# Patient Record
Sex: Male | Born: 1995 | Race: Black or African American | Hispanic: No | Marital: Single | Smoking: Never smoker
Health system: Southern US, Community
[De-identification: ages and names within clinical notes are randomized; demographics above are authoritative.]

## PROBLEM LIST (undated history)

## (undated) DIAGNOSIS — R630 Anorexia: Secondary | ICD-10-CM

## (undated) DIAGNOSIS — J45909 Unspecified asthma, uncomplicated: Secondary | ICD-10-CM

## (undated) DIAGNOSIS — F913 Oppositional defiant disorder: Secondary | ICD-10-CM

## (undated) DIAGNOSIS — F79 Unspecified intellectual disabilities: Secondary | ICD-10-CM

## (undated) DIAGNOSIS — R45851 Suicidal ideations: Secondary | ICD-10-CM

## (undated) DIAGNOSIS — T7840XA Allergy, unspecified, initial encounter: Secondary | ICD-10-CM

## (undated) DIAGNOSIS — F909 Attention-deficit hyperactivity disorder, unspecified type: Secondary | ICD-10-CM

## (undated) DIAGNOSIS — R45850 Homicidal ideations: Secondary | ICD-10-CM

## (undated) DIAGNOSIS — K219 Gastro-esophageal reflux disease without esophagitis: Secondary | ICD-10-CM

---
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
## 2017-07-16 ENCOUNTER — Other Ambulatory Visit: Payer: Self-pay

## 2017-07-16 ENCOUNTER — Ambulatory Visit (HOSPITAL_COMMUNITY)
Admission: EM | Admit: 2017-07-16 | Discharge: 2017-07-16 | Disposition: A | Discharge status: Home / Self Care | Payer: Medicaid Other | Attending: Family Medicine | Admitting: Family Medicine

## 2017-07-16 ENCOUNTER — Encounter (HOSPITAL_COMMUNITY): Payer: Self-pay | Admitting: Emergency Medicine

## 2017-07-16 ENCOUNTER — Ambulatory Visit (INDEPENDENT_AMBULATORY_CARE_PROVIDER_SITE_OTHER): Payer: Medicaid Other

## 2017-07-16 DIAGNOSIS — S39012A Strain of muscle, fascia and tendon of lower back, initial encounter: Secondary | ICD-10-CM

## 2017-07-16 DIAGNOSIS — R0789 Other chest pain: Secondary | ICD-10-CM | POA: Diagnosis not present

## 2017-07-16 MED ORDER — KETOROLAC TROMETHAMINE 30 MG/ML IJ SOLN
30.0000 mg | Freq: Once | INTRAMUSCULAR | Status: AC
  Administered 2017-07-16: 30 mg via INTRAMUSCULAR

## 2017-07-16 MED ORDER — ALBUTEROL SULFATE HFA 108 (90 BASE) MCG/ACT IN AERS: 2.0000 | INHALATION_SPRAY | Freq: Four times a day (QID) | RESPIRATORY_TRACT | 0 refills | Status: DC | PRN

## 2017-07-16 MED ORDER — NAPROXEN 375 MG PO TABS: 375.0000 mg | ORAL_TABLET | Freq: Two times a day (BID) | ORAL | 0 refills | Status: DC

## 2017-07-16 MED ORDER — KETOROLAC TROMETHAMINE 30 MG/ML IJ SOLN
INTRAMUSCULAR | Status: AC
  Filled 2017-07-16: qty 1

## 2017-07-16 NOTE — ED Notes (Signed)
Patient discharged by provider.

## 2017-07-16 NOTE — ED Triage Notes (Signed)
C/o low back pain onset one week and today onset of chest pain

## 2017-07-16 NOTE — Discharge Instructions (Signed)
Naproxen twice a day, take with food. Use of inhaler every 6 hours as needed may help some with chest pain. See back exercises to initiate. Please follow up with your primary care provider in the next 1-2 weeks for recheck of symptoms. If worsening of chest pain, develop dizziness, shortness of breath or otherwise worsening please go to ER for further evaluation.

## 2017-07-16 NOTE — ED Provider Notes (Signed)
[facility]    CSN: [id number] Arrival date & time: 07/16/17  1647     History   Chief Complaint Chief Complaint  Patient presents with  . Back Pain    HPI LEUL NARRAMORE is a 22 y.o. male.   Yehoshua presents with his mother with complaints of right low back pain which has been ongoing for months now, worse with playing basketball, has been off and on. Also complaints of left sided chest pain which started this afternoon 1 hour prior to arrival. Worse with activity and with deep breathing. Has had occasional shortness of breath  For some time, worse with activity. Slight cough. Without congestion. Has not taken any medications for his back pain. Took aspirin this afternoon for chest pain. Significant behavioral health history, asthma, htn, gerd.    ROS per HPI.      Past Medical History:  Diagnosis Date  . ADHD (attention deficit hyperactivity disorder)   . Allergy   . Appetite lost 07/20/2015  . Asthma   . GERD (gastroesophageal reflux disease) 07/20/2015  . Homicidal ideations   . Intellectual disability 02/26/2015  . ODD (oppositional defiant disorder)   . Suicidal ideations     Patient Active Problem List   Diagnosis Date Noted  . Aggressive behavior 11/30/2015  . Hypertension   . Intentional overdose of drug in tablet form (HCC)   . Other secondary hypertension   . Somnolence   . Overdose 11/09/2015  . GERD (gastroesophageal reflux disease) 07/20/2015  . Appetite lost 07/20/2015  . Suicidal ideation   . DMDD (disruptive mood dysregulation disorder) (HCC) 03/13/2015  . Homicidal ideations 03/13/2015  . Intellectual disability 02/26/2015  . Attention-deficit hyperactivity disorder, predominantly hyperactive type   . ADHD (attention deficit hyperactivity disorder), combined type 01/31/2015  . Oppositional defiant disorder 01/31/2015  . Mood disorder (HCC) 01/31/2015    History reviewed. No pertinent surgical history.     Home Medications     Prior to Admission medications   Medication Sig Start Date End Date Taking? Authorizing Provider  FLUoxetine (PROZAC) 10 MG tablet Take 10 mg by mouth daily.   Yes [provider]  gabapentin (NEURONTIN) 300 MG capsule Take 300 mg by mouth 3 (three) times daily.   Yes [provider]  adapalene (DIFFERIN) 0.1 % gel Apply 1 application topically See admin instructions. Apply to face every other night (alternate with benzaclin gel)    [provider]  albuterol (PROAIR HFA) 108 (90 BASE) MCG/ACT inhaler Inhale 2 puffs into the lungs every 6 (six) hours as needed for wheezing or shortness of breath.    [provider]  cetirizine (ZYRTEC) 10 MG tablet Take 1 tablet (10 mg total) by mouth daily. 11/15/15   Freddrick March, MD  clindamycin-benzoyl peroxide (BENZACLIN) gel Apply 1 application topically See admin instructions. Apply thin amount to face every other night (alternate with differin gel)    [provider]  fluticasone (FLONASE) 50 MCG/ACT nasal spray Place 1 spray into both nostrils daily. Patient taking differently: Place 1 spray into both nostrils daily as needed for allergies.  11/15/15   Freddrick March, MD  fluticasone (FLOVENT HFA) 220 MCG/ACT inhaler Inhale 2 puffs into the lungs daily.     [provider]  guanFACINE (INTUNIV) 2 MG TB24 ER tablet Take 2 mg by mouth at bedtime. 07/09/16   [provider]  montelukast (SINGULAIR) 10 MG tablet Take 1 tablet (10 mg total) by mouth at bedtime. Patient not  taking: Reported on 08/03/2016 11/15/15   Freddrick MarchAmin, Yashika, MD  montelukast (SINGULAIR) 5 MG chewable tablet Chew 5 mg by mouth at bedtime.  07/09/16   [provider]  naproxen (NAPROSYN) 375 MG tablet Take 1 tablet (375 mg total) by mouth 2 (two) times daily. 07/16/17   Georgetta HaberBurky, Jermaine Tholl B, NP  omeprazole (PRILOSEC) 10 MG capsule Take 1 capsule (10 mg total) by mouth daily. Patient taking differently: Take 10 mg by mouth daily  before breakfast.  11/15/15   Freddrick MarchAmin, Yashika, MD  traZODone (DESYREL) 150 MG tablet Take 300 mg by mouth at bedtime 01/26/16   [provider]  ziprasidone (GEODON) 40 MG capsule Take 40 mg by mouth 2 (two) times daily with a meal.     [provider]    Family History Family History  Adopted: Yes    Social History Social History   Tobacco Use  . Smoking status: Never Smoker  . Smokeless tobacco: Never Used  Substance Use Topics  . Alcohol use: Yes    Comment: I mix henessy with gatorade  . Drug use: No     Allergies   Patient has no known allergies.   Review of Systems Review of Systems   Physical Exam Triage Vital Signs ED Triage Vitals  Enc Vitals Group     BP 07/16/17 1711 (!) 99/58     Pulse Rate 07/16/17 1711 64     Resp --      Temp 07/16/17 1711 (!) 97.2 F (36.2 C)     Temp Source 07/16/17 1711 Oral     SpO2 07/16/17 1711 99 %     Weight --      Height --      Head Circumference --      Peak Flow --      Pain Score 07/16/17 1707 4     Pain Loc --      Pain Edu? --      Excl. in GC? --    No data found.  Updated Vital Signs BP (!) 99/58 (BP Location: Left Arm)   Pulse 64   Temp (!) 97.2 F (36.2 C) (Oral)   SpO2 99%   Visual Acuity Right Eye Distance:   Left Eye Distance:   Bilateral Distance:    Right Eye Near:   Left Eye Near:    Bilateral Near:     Physical Exam  Constitutional: He is oriented to person, place, and time. He appears well-developed and well-nourished.  Cardiovascular: Normal rate, regular rhythm, normal heart sounds and normal pulses.  Pulmonary/Chest: Effort normal and breath sounds normal.  Musculoskeletal:       Lumbar back: He exhibits tenderness and pain. He exhibits normal range of motion, no bony tenderness, no swelling, no spasm and normal pulse.       Back:  Neurological: He is alert and oriented to person, place, and time.  Skin: Skin is warm and dry.   EKG with sinus bradycardia without  acute findings.   UC Treatments / Results  Labs (all labs ordered are listed, but only abnormal results are displayed) Labs Reviewed - No data to display  EKG  EKG Interpretation None       Radiology Dg Chest 2 View  Result Date: 07/16/2017 CLINICAL DATA:  Chest pain shortness of breath, LEFT anterior chest wall pain for 1 hour, history asthma EXAM: CHEST - 2 VIEW COMPARISON:  None FINDINGS: Normal heart size, mediastinal contours, and pulmonary vascularity. Minimal peribronchial  thickening. No pulmonary infiltrate, pleural effusion, or pneumothorax. Bones unremarkable. IMPRESSION: Minimal peribronchial thickening which could reflect bronchitis or asthma. No acute infiltrate. Electronically Signed   By: Ulyses Southward M.D.   On: 07/16/2017 18:43    Procedures Procedures (including critical care time)  Medications Ordered in UC Medications  ketorolac (TORADOL) 30 MG/ML injection 30 mg (30 mg Intramuscular Given 07/16/17 1803)     Initial Impression / Assessment and Plan / UC Course  I have reviewed the triage vital signs and the nursing notes.  Pertinent labs & imaging results that were available during my care of the patient were reviewed by me and considered in my medical decision making (see chart for details).     toradol provided and recommend naproxen twice a day for back pain. Reflux vs anxiety vs asthma as source of chest pain. Non toxic in appearance, without distress, without shortness of breath. ekg and chest xray without acute findings. Naproxen twice a day for pain. Encouraged close follow up with primary care doctor for recheck as may need further evaluation and treatment if no improvement of symptoms. Patient verbalized understanding and agreeable to plan.    Final Clinical Impressions(s) / UC Diagnoses   Final diagnoses:  Strain of lumbar region, initial encounter  Other chest pain    ED Discharge Orders        Ordered    naproxen (NAPROSYN) 375 MG tablet  2  times daily     07/16/17 1834       Controlled Substance Prescriptions [place] Controlled Substance Registry consulted? Not Applicable   Georgetta Haber, NP 07/16/17 [phone]

## 2017-07-27 ENCOUNTER — Encounter (HOSPITAL_COMMUNITY): Payer: Self-pay | Admitting: Emergency Medicine

## 2017-07-27 ENCOUNTER — Other Ambulatory Visit: Payer: Self-pay

## 2017-07-27 ENCOUNTER — Emergency Department (HOSPITAL_COMMUNITY)
Admission: EM | Admit: 2017-07-27 | Discharge: 2017-07-28 | Disposition: A | Discharge status: Other Acute Inpatient Hospital | Payer: Medicaid Other | Attending: Emergency Medicine | Admitting: Emergency Medicine

## 2017-07-27 DIAGNOSIS — R45851 Suicidal ideations: Secondary | ICD-10-CM

## 2017-07-27 DIAGNOSIS — F79 Unspecified intellectual disabilities: Secondary | ICD-10-CM | POA: Insufficient documentation

## 2017-07-27 DIAGNOSIS — R45850 Homicidal ideations: Secondary | ICD-10-CM | POA: Insufficient documentation

## 2017-07-27 DIAGNOSIS — F902 Attention-deficit hyperactivity disorder, combined type: Secondary | ICD-10-CM | POA: Insufficient documentation

## 2017-07-27 DIAGNOSIS — F913 Oppositional defiant disorder: Secondary | ICD-10-CM | POA: Insufficient documentation

## 2017-07-27 DIAGNOSIS — F3481 Disruptive mood dysregulation disorder: Secondary | ICD-10-CM | POA: Insufficient documentation

## 2017-07-27 DIAGNOSIS — Z79899 Other long term (current) drug therapy: Secondary | ICD-10-CM | POA: Insufficient documentation

## 2017-07-27 DIAGNOSIS — F332 Major depressive disorder, recurrent severe without psychotic features: Secondary | ICD-10-CM | POA: Insufficient documentation

## 2017-07-27 DIAGNOSIS — I10 Essential (primary) hypertension: Secondary | ICD-10-CM | POA: Insufficient documentation

## 2017-07-27 LAB — COMPREHENSIVE METABOLIC PANEL
ALBUMIN: 4.7 g/dL (ref 3.5–5.0)
ALK PHOS: 114 U/L (ref 38–126)
ALT: 37 U/L (ref 17–63)
ANION GAP: 10 (ref 5–15)
AST: 44 U/L — ABNORMAL HIGH (ref 15–41)
BUN: 15 mg/dL (ref 6–20)
CALCIUM: 9.5 mg/dL (ref 8.9–10.3)
CO2: 29 mmol/L (ref 22–32)
Chloride: 103 mmol/L (ref 101–111)
Creatinine, Ser: 1.24 mg/dL (ref 0.61–1.24)
GFR calc Af Amer: >60 mL/min (ref >60)
GFR calc non Af Amer: >60 mL/min (ref >60)
Glucose, Bld: 97 mg/dL (ref 65–99)
POTASSIUM: 4 mmol/L (ref 3.5–5.1)
SODIUM: 142 mmol/L (ref 135–145)
Total Bilirubin: 1.2 mg/dL (ref 0.3–1.2)
Total Protein: 8.4 g/dL — ABNORMAL HIGH (ref 6.5–8.1)

## 2017-07-27 LAB — RAPID URINE DRUG SCREEN, HOSP PERFORMED
Amphetamines: NOT DETECTED
Barbiturates: NOT DETECTED
Benzodiazepines: NOT DETECTED
COCAINE: NOT DETECTED
OPIATES: NOT DETECTED
TETRAHYDROCANNABINOL: NOT DETECTED

## 2017-07-27 LAB — CBC
HCT: 46.9 % (ref 39.0–52.0)
HEMOGLOBIN: 15.4 g/dL (ref 13.0–17.0)
MCH: 26.8 pg (ref 26.0–34.0)
MCHC: 32.8 g/dL (ref 30.0–36.0)
MCV: 81.6 fL (ref 78.0–100.0)
Platelets: 212 10*3/uL (ref 150–400)
RBC: 5.75 MIL/uL (ref 4.22–5.81)
RDW: 14.4 % (ref 11.5–15.5)
WBC: 6.3 10*3/uL (ref 4.0–10.5)

## 2017-07-27 LAB — URINALYSIS, ROUTINE W REFLEX MICROSCOPIC
Bilirubin Urine: NEGATIVE
GLUCOSE, UA: NEGATIVE mg/dL
Hgb urine dipstick: NEGATIVE
Ketones, ur: NEGATIVE mg/dL
LEUKOCYTES UA: NEGATIVE
Nitrite: NEGATIVE
PH: 7 (ref 5.0–8.0)
Protein, ur: NEGATIVE mg/dL
Specific Gravity, Urine: 1.017 (ref 1.005–1.030)

## 2017-07-27 LAB — ETHANOL: Alcohol, Ethyl (B): <10 mg/dL (ref <10)

## 2017-07-27 LAB — ACETAMINOPHEN LEVEL

## 2017-07-27 LAB — SALICYLATE LEVEL: Salicylate Lvl: <7 mg/dL (ref 2.8–30.0)

## 2017-07-27 MED ORDER — FLUTICASONE PROPIONATE HFA 220 MCG/ACT IN AERO: 2.0000 | INHALATION_SPRAY | Freq: Every day | RESPIRATORY_TRACT | Status: DC

## 2017-07-27 MED ORDER — BUDESONIDE 0.5 MG/2ML IN SUSP
0.5000 mg | Freq: Two times a day (BID) | RESPIRATORY_TRACT | Status: DC
  Filled 2017-07-27 (×4): qty 2

## 2017-07-27 MED ORDER — LORATADINE 10 MG PO TABS
10.0000 mg | ORAL_TABLET | Freq: Every day | ORAL | Status: DC
  Administered 2017-07-28: 10 mg via ORAL
  Filled 2017-07-27: qty 1

## 2017-07-27 MED ORDER — FLUTICASONE PROPIONATE 50 MCG/ACT NA SUSP
1.0000 | Freq: Every day | NASAL | Status: DC | PRN
Start: 2017-07-27 — End: 2017-07-28

## 2017-07-27 MED ORDER — ZIPRASIDONE HCL 20 MG PO CAPS
80.0000 mg | ORAL_CAPSULE | Freq: Two times a day (BID) | ORAL | Status: DC
  Administered 2017-07-27 – 2017-07-28 (×2): 80 mg via ORAL
  Filled 2017-07-27 (×3): qty 4

## 2017-07-27 MED ORDER — PANTOPRAZOLE SODIUM 40 MG PO TBEC
40.0000 mg | DELAYED_RELEASE_TABLET | Freq: Every day | ORAL | Status: DC
Start: 2017-07-28 — End: 2017-07-28
  Administered 2017-07-28: 40 mg via ORAL
  Filled 2017-07-27: qty 1

## 2017-07-27 MED ORDER — ONDANSETRON HCL 4 MG PO TABS: 4.0000 mg | ORAL_TABLET | Freq: Three times a day (TID) | ORAL | Status: DC | PRN

## 2017-07-27 MED ORDER — MONTELUKAST SODIUM 5 MG PO CHEW
5.0000 mg | CHEWABLE_TABLET | Freq: Every day | ORAL | Status: DC
  Administered 2017-07-27: 5 mg via ORAL
  Filled 2017-07-27: qty 1

## 2017-07-27 MED ORDER — TRAZODONE HCL 100 MG PO TABS
100.0000 mg | ORAL_TABLET | Freq: Every day | ORAL | Status: DC
  Administered 2017-07-27: 100 mg via ORAL
  Filled 2017-07-27: qty 1

## 2017-07-27 MED ORDER — GUANFACINE HCL ER 1 MG PO TB24
4.0000 mg | ORAL_TABLET | Freq: Every day | ORAL | Status: DC
  Administered 2017-07-28: 4 mg via ORAL
  Filled 2017-07-27: qty 4

## 2017-07-27 MED ORDER — ALBUTEROL SULFATE HFA 108 (90 BASE) MCG/ACT IN AERS: 2.0000 | INHALATION_SPRAY | Freq: Four times a day (QID) | RESPIRATORY_TRACT | Status: DC | PRN

## 2017-07-27 MED ORDER — GABAPENTIN 300 MG PO CAPS
300.0000 mg | ORAL_CAPSULE | Freq: Two times a day (BID) | ORAL | Status: DC
  Administered 2017-07-27 – 2017-07-28 (×2): 300 mg via ORAL
  Filled 2017-07-27 (×2): qty 1

## 2017-07-27 MED ORDER — ACETAMINOPHEN 325 MG PO TABS: 650.0000 mg | ORAL_TABLET | ORAL | Status: DC | PRN

## 2017-07-27 NOTE — ED Notes (Signed)
Patient with TTS  

## 2017-07-27 NOTE — ED Notes (Addendum)
Patient appear hyperactive. Moving and jumping around in his room. Pulling off his pants and running out to the hall way. Patient was easily redirectable. Dr Madilyn Hookees notified of patient's behavior and request to start scheduled Geodon tonight.

## 2017-07-27 NOTE — ED Notes (Addendum)
Patient admitted on unit. Cheerful and pleasant upon approach. Denies active SI/HI, AH/VH at this time. Patient contracts for safety while on unit. Patient stated "I just want my meds and sleep". Snacks and drink offered. Routine safety checks maintained. will continue to monitor patient.

## 2017-07-27 NOTE — ED Notes (Signed)
Patient refused Neb treatment stated "I only take it when I wheeze. I am good now".  Patient respiration even and unlabored. No distress noted. Encouraged patient to asked for it when needed. Will continue to monitor patient.
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
## 2017-07-27 NOTE — ED Notes (Signed)
Patient has a legal guardian (his mom) - Sandrea HammondWendy Mehlhoff. Phone # [phone] Copy is in patient's chart.

## 2017-07-27 NOTE — ED Notes (Signed)
Per Dr Madilyn Hookees, patient can start Geodon 80 mg PO tonight

## 2017-07-27 NOTE — ED Notes (Signed)
Bed: [facility] Expected date:  Expected time:  Means of arrival:  Comments: 

## 2017-07-28 ENCOUNTER — Inpatient Hospital Stay (HOSPITAL_COMMUNITY)
Admission: AD | Admit: 2017-07-28 | Discharge: 2017-08-13 | DRG: 885 | Disposition: A | Discharge status: Home / Self Care | Payer: Medicaid Other | Attending: Psychiatry | Admitting: Psychiatry

## 2017-07-28 ENCOUNTER — Encounter (HOSPITAL_COMMUNITY): Payer: Self-pay | Admitting: *Deleted

## 2017-07-28 ENCOUNTER — Other Ambulatory Visit: Payer: Self-pay

## 2017-07-28 DIAGNOSIS — F79 Unspecified intellectual disabilities: Secondary | ICD-10-CM | POA: Diagnosis present

## 2017-07-28 DIAGNOSIS — Z560 Unemployment, unspecified: Secondary | ICD-10-CM | POA: Diagnosis not present

## 2017-07-28 DIAGNOSIS — Z62821 Parent-adopted child conflict: Secondary | ICD-10-CM

## 2017-07-28 DIAGNOSIS — F332 Major depressive disorder, recurrent severe without psychotic features: Secondary | ICD-10-CM | POA: Diagnosis not present

## 2017-07-28 DIAGNOSIS — F3481 Disruptive mood dysregulation disorder: Secondary | ICD-10-CM | POA: Diagnosis present

## 2017-07-28 DIAGNOSIS — I158 Other secondary hypertension: Secondary | ICD-10-CM | POA: Diagnosis present

## 2017-07-28 DIAGNOSIS — J45909 Unspecified asthma, uncomplicated: Secondary | ICD-10-CM | POA: Diagnosis present

## 2017-07-28 DIAGNOSIS — F419 Anxiety disorder, unspecified: Secondary | ICD-10-CM | POA: Diagnosis not present

## 2017-07-28 DIAGNOSIS — Z6229 Other upbringing away from parents: Secondary | ICD-10-CM | POA: Diagnosis not present

## 2017-07-28 DIAGNOSIS — K219 Gastro-esophageal reflux disease without esophagitis: Secondary | ICD-10-CM | POA: Diagnosis present

## 2017-07-28 DIAGNOSIS — R45851 Suicidal ideations: Secondary | ICD-10-CM | POA: Diagnosis present

## 2017-07-28 DIAGNOSIS — F913 Oppositional defiant disorder: Secondary | ICD-10-CM | POA: Diagnosis present

## 2017-07-28 DIAGNOSIS — Z79899 Other long term (current) drug therapy: Secondary | ICD-10-CM | POA: Diagnosis not present

## 2017-07-28 DIAGNOSIS — F17200 Nicotine dependence, unspecified, uncomplicated: Secondary | ICD-10-CM | POA: Diagnosis present

## 2017-07-28 DIAGNOSIS — Z813 Family history of other psychoactive substance abuse and dependence: Secondary | ICD-10-CM

## 2017-07-28 DIAGNOSIS — R451 Restlessness and agitation: Secondary | ICD-10-CM | POA: Diagnosis not present

## 2017-07-28 DIAGNOSIS — Z915 Personal history of self-harm: Secondary | ICD-10-CM | POA: Diagnosis not present

## 2017-07-28 DIAGNOSIS — G4700 Insomnia, unspecified: Secondary | ICD-10-CM | POA: Diagnosis present

## 2017-07-28 DIAGNOSIS — Z7951 Long term (current) use of inhaled steroids: Secondary | ICD-10-CM | POA: Diagnosis not present

## 2017-07-28 DIAGNOSIS — I10 Essential (primary) hypertension: Secondary | ICD-10-CM | POA: Diagnosis present

## 2017-07-28 DIAGNOSIS — F901 Attention-deficit hyperactivity disorder, predominantly hyperactive type: Secondary | ICD-10-CM | POA: Diagnosis present

## 2017-07-28 MED ORDER — LORAZEPAM 0.5 MG PO TABS
0.5000 mg | ORAL_TABLET | Freq: Four times a day (QID) | ORAL | Status: DC | PRN
  Administered 2017-07-29 – 2017-08-12 (×11): 0.5 mg via ORAL
  Filled 2017-07-28 (×11): qty 1

## 2017-07-28 MED ORDER — GUANFACINE HCL ER 1 MG PO TB24
1.0000 mg | ORAL_TABLET | Freq: Every day | ORAL | Status: DC
  Filled 2017-07-28: qty 1

## 2017-07-28 MED ORDER — ZIPRASIDONE HCL 80 MG PO CAPS
80.0000 mg | ORAL_CAPSULE | Freq: Two times a day (BID) | ORAL | Status: DC
Start: 2017-07-29 — End: 2017-08-14
  Administered 2017-07-29 – 2017-08-13 (×31): 80 mg via ORAL
  Filled 2017-07-28 (×37): qty 1

## 2017-07-28 MED ORDER — ACETAMINOPHEN 325 MG PO TABS
650.0000 mg | ORAL_TABLET | Freq: Four times a day (QID) | ORAL | Status: DC | PRN
  Administered 2017-07-31 – 2017-08-13 (×13): 650 mg via ORAL
  Filled 2017-07-28 (×14): qty 2

## 2017-07-28 MED ORDER — HYDROXYZINE HCL 25 MG PO TABS
25.0000 mg | ORAL_TABLET | Freq: Three times a day (TID) | ORAL | Status: DC | PRN
  Administered 2017-07-29 – 2017-08-02 (×5): 25 mg via ORAL
  Filled 2017-07-28 (×5): qty 1

## 2017-07-28 MED ORDER — GABAPENTIN 300 MG PO CAPS
300.0000 mg | ORAL_CAPSULE | Freq: Two times a day (BID) | ORAL | Status: DC
  Administered 2017-07-28 – 2017-07-30 (×4): 300 mg via ORAL
  Filled 2017-07-28 (×10): qty 1

## 2017-07-28 MED ORDER — ALBUTEROL SULFATE HFA 108 (90 BASE) MCG/ACT IN AERS: 1.0000 | INHALATION_SPRAY | Freq: Four times a day (QID) | RESPIRATORY_TRACT | Status: DC | PRN

## 2017-07-28 MED ORDER — FLUTICASONE PROPIONATE 50 MCG/ACT NA SUSP: 1.0000 | Freq: Every day | NASAL | Status: DC | PRN

## 2017-07-28 MED ORDER — MAGNESIUM HYDROXIDE 400 MG/5ML PO SUSP: 30.0000 mL | Freq: Every day | ORAL | Status: DC | PRN

## 2017-07-28 MED ORDER — TRAZODONE HCL 100 MG PO TABS
100.0000 mg | ORAL_TABLET | Freq: Every day | ORAL | Status: DC
  Administered 2017-07-28 – 2017-07-29 (×2): 100 mg via ORAL
  Filled 2017-07-28 (×5): qty 1

## 2017-07-28 MED ORDER — PANTOPRAZOLE SODIUM 40 MG PO TBEC
40.0000 mg | DELAYED_RELEASE_TABLET | Freq: Every day | ORAL | Status: DC
Start: 2017-07-29 — End: 2017-08-14
  Administered 2017-07-29 – 2017-08-13 (×16): 40 mg via ORAL
  Filled 2017-07-28 (×19): qty 1

## 2017-07-28 MED ORDER — GUANFACINE HCL ER 4 MG PO TB24
4.0000 mg | ORAL_TABLET | Freq: Every day | ORAL | Status: DC
  Administered 2017-07-29: 4 mg via ORAL
  Filled 2017-07-28 (×2): qty 1

## 2017-07-28 MED ORDER — BUDESONIDE 0.5 MG/2ML IN SUSP
0.5000 mg | Freq: Two times a day (BID) | RESPIRATORY_TRACT | Status: DC
  Administered 2017-08-03 – 2017-08-08 (×3): 0.5 mg via RESPIRATORY_TRACT
  Filled 2017-07-28 (×36): qty 2

## 2017-07-28 MED ORDER — LORATADINE 10 MG PO TABS
10.0000 mg | ORAL_TABLET | Freq: Every day | ORAL | Status: DC
Start: 2017-07-29 — End: 2017-08-14
  Administered 2017-07-29 – 2017-08-13 (×16): 10 mg via ORAL
  Filled 2017-07-28 (×21): qty 1

## 2017-07-28 MED ORDER — MONTELUKAST SODIUM 5 MG PO CHEW
5.0000 mg | CHEWABLE_TABLET | Freq: Every day | ORAL | Status: DC
  Administered 2017-07-29: 5 mg via ORAL
  Filled 2017-07-28 (×3): qty 1

## 2017-07-28 MED ORDER — TRAZODONE HCL 50 MG PO TABS: 50.0000 mg | ORAL_TABLET | Freq: Every evening | ORAL | Status: DC | PRN

## 2017-07-28 MED ORDER — ONDANSETRON HCL 4 MG PO TABS: 4.0000 mg | ORAL_TABLET | Freq: Three times a day (TID) | ORAL | Status: DC | PRN

## 2017-07-28 MED ORDER — ALBUTEROL SULFATE HFA 108 (90 BASE) MCG/ACT IN AERS: 2.0000 | INHALATION_SPRAY | Freq: Four times a day (QID) | RESPIRATORY_TRACT | Status: DC | PRN

## 2017-07-28 MED ORDER — ALUM & MAG HYDROXIDE-SIMETH 200-200-20 MG/5ML PO SUSP: 30.0000 mL | ORAL | Status: DC | PRN

## 2017-07-28 MED ORDER — GABAPENTIN 100 MG PO CAPS
100.0000 mg | ORAL_CAPSULE | Freq: Three times a day (TID) | ORAL | Status: DC
  Administered 2017-07-28: 100 mg via ORAL
  Filled 2017-07-28 (×3): qty 1

## 2017-07-28 MED ORDER — ZIPRASIDONE HCL 20 MG PO CAPS
20.0000 mg | ORAL_CAPSULE | Freq: Two times a day (BID) | ORAL | Status: DC
  Administered 2017-07-28: 20 mg via ORAL
  Filled 2017-07-28 (×3): qty 1
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
## 2017-07-28 NOTE — ED Notes (Signed)
Patient alert and oriented, taking po medications without difficulty.  Patient had breakfast and now is resting again.
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
## 2017-07-28 NOTE — Progress Notes (Signed)
Adult Psychoeducational Group Note  Date:  07/28/2017 Time:  10:09 PM  Group Topic/Focus:  Wrap-Up Group:   The focus of this group is to help patients review their daily goal of treatment and discuss progress on daily workbooks.  Participation Level:  Active  Participation Quality:  Appropriate  Affect:  Appropriate  Cognitive:  Appropriate  Insight: Appropriate  Engagement in Group:  Engaged  Modes of Intervention:  Discussion  Additional Comments:  Patient attended group and said that his day was a 4.  His coping skills were socializing, writing and writing his music.   Izella Ybanez W Julane Crock 07/28/2017, 10:09 PM
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
## 2017-07-28 NOTE — ED Notes (Signed)
Report called to Jan RN at behavioral health.

## 2017-07-28 NOTE — Tx Team (Signed)
Initial Treatment Plan 07/28/2017 4:36 PM Maurice [name] [place] [id number]    PATIENT STRESSORS: Marital or family conflict   PATIENT STRENGTHS: Communication skills General fund of knowledge Physical Health Special hobby/interest Supportive family/friends   PATIENT IDENTIFIED PROBLEMS: SI  depression  anger                 DISCHARGE CRITERIA:  Ability to meet basic life and health needs Adequate post-discharge living arrangements Improved stabilization in mood, thinking, and/or behavior Motivation to continue treatment in a less acute level of care Need for constant or close observation no longer present Reduction of life-threatening or endangering symptoms to within safe limits Safe-care adequate arrangements made Verbal commitment to aftercare and medication compliance  PRELIMINARY DISCHARGE PLAN: Outpatient therapy Return to previous living arrangement  PATIENT/FAMILY INVOLVEMENT: This treatment plan has been presented to and reviewed with the patient, Maurice [name] [place], and/or family member, .  The patient and family have been given the opportunity to ask questions and make suggestions.  Beatrix ShipperWright, Chava Dulac Martin, RN 07/28/2017, 4:36 PM

## 2017-07-28 NOTE — Plan of Care (Signed)
  Problem: Safety: Goal: Periods of time without injury will increase Outcome: Progressing   Problem: Coping: Goal: Coping ability will improve Outcome: Progressing   

## 2017-07-28 NOTE — BHH Counselor (Signed)
Phone call to pt's mother (& guardian), Ubaldo Daywalt, [phone],  to update re: pt's admission to [facility]. Ms. Currey states she has an appointment at [facility] and will make contact with [facility] afterwards. Ms. Dettman was advised that Richelle Ito from [facility] met with pt and she will continue to work with [facility] on finding a new group home for pt. Venetia Constable is also working on respite and other in-home supports. The [facility] was also discussed as a future option as a preventive option to give mother a break. Richelle Ito, [phone], Clinical Coordinator at [facility], would like to participate in pt's d/c plan when leaving the hospital.

## 2017-07-28 NOTE — Progress Notes (Signed)
D: Pt denies SI/HI/AVH. Pt is pleasant and cooperative. Pt has been observed trying to go into male room. Pt was informed that this was not acceptable and pt appeared tom understand. Pt visible on milieu interacting with peers. Pt has minimal interaction with writer and forwards little, but will answer questions.   A: Pt was offered support and encouragement. Pt was given scheduled medications. Pt was encourage to attend groups. Q 15 minute checks were done for safety.   R:Pt attends groups and interacts well with peers and staff. Pt is taking medication. Pt has no complaints.Pt receptive to treatment and safety maintained on unit.

## 2017-07-28 NOTE — ED Notes (Signed)
Attempted to call report to behavioral health.  Nurse will call back.
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
## 2017-07-29 DIAGNOSIS — Z79899 Other long term (current) drug therapy: Secondary | ICD-10-CM

## 2017-07-29 DIAGNOSIS — F332 Major depressive disorder, recurrent severe without psychotic features: Principal | ICD-10-CM

## 2017-07-29 DIAGNOSIS — F419 Anxiety disorder, unspecified: Secondary | ICD-10-CM

## 2017-07-29 LAB — LIPID PANEL
CHOL/HDL RATIO: 2.5 ratio
CHOLESTEROL: 156 mg/dL (ref 0–200)
HDL: 62 mg/dL (ref >40)
LDL CALC: 84 mg/dL (ref 0–99)
TRIGLYCERIDES: 50 mg/dL (ref <150)
VLDL: 10 mg/dL (ref 0–40)

## 2017-07-29 LAB — HEMOGLOBIN A1C
Hgb A1c MFr Bld: 5.8 % — ABNORMAL HIGH (ref 4.8–5.6)
Mean Plasma Glucose: 119.76 mg/dL

## 2017-07-29 LAB — TSH: TSH: 4.508 u[IU]/mL — ABNORMAL HIGH (ref 0.350–4.500)
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
## 2017-07-29 NOTE — Progress Notes (Signed)
Pt was turing on the light and playing with the [facility] unit. Pt was keeping his new roommate up.

## 2017-07-29 NOTE — Progress Notes (Signed)
Pt found in the dayroom by staff massaging a male pt feet. Pt stated when asked that he did it  unconsciously and became irritable about it. Pt informed that, that was inappropriate and not allowed in the unit.
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
## 2017-07-29 NOTE — Tx Team (Signed)
Interdisciplinary Treatment and Diagnostic Plan Update  07/29/2017 Time of Session: 11:00am Maurice DollarJaivon O [name] MRN: [id number]  Principal Diagnosis: MDD (major depressive disorder), recurrent severe, without psychosis (HCC)  Secondary Diagnoses: Principal Problem:   MDD (major depressive disorder), recurrent severe, without psychosis (HCC)   Current Medications:  Current Facility-Administered Medications  Medication Dose Route Frequency Provider Last Rate Last Dose  . acetaminophen (TYLENOL) tablet 650 mg  650 mg Oral Q6H PRN Laveda AbbeParks, Laurie Britton, NP      . albuterol (PROVENTIL HFA;VENTOLIN HFA) 108 (90 Base) MCG/ACT inhaler 2 puff  2 puff Inhalation Q6H PRN Laveda AbbeParks, Laurie Britton, NP      . alum & mag hydroxide-simeth (MAALOX/MYLANTA) 200-200-20 MG/5ML suspension 30 mL  30 mL Oral Q4H PRN Laveda AbbeParks, Laurie Britton, NP      . budesonide (PULMICORT) nebulizer solution 0.5 mg  0.5 mg Nebulization BID Laveda AbbeParks, Laurie Britton, NP      . fluticasone [facility](FLONASE) 50 MCG/ACT nasal spray 1 spray  1 spray Each Nare Daily PRN Laveda AbbeParks, Laurie Britton, NP      . gabapentin (NEURONTIN) capsule 300 mg  300 mg Oral BID Laveda AbbeParks, Laurie Britton, NP   300 mg at 07/29/17 0758  . hydrOXYzine (ATARAX/VISTARIL) tablet 25 mg  25 mg Oral TID PRN Laveda AbbeParks, Laurie Britton, NP      . loratadine (CLARITIN) tablet 10 mg  10 mg Oral Daily Laveda AbbeParks, Laurie Britton, NP   10 mg at 07/29/17 0758  . LORazepam (ATIVAN) tablet 0.5 mg  0.5 mg Oral Q6H PRN Cobos, Rockey SituFernando A, MD   0.5 mg at 07/29/17 0058  . magnesium hydroxide (MILK OF MAGNESIA) suspension 30 mL  30 mL Oral Daily PRN Laveda AbbeParks, Laurie Britton, NP      . montelukast (SINGULAIR) chewable tablet 5 mg  5 mg Oral QHS Laveda AbbeParks, Laurie Britton, NP      . ondansetron [facility](ZOFRAN) tablet 4 mg  4 mg Oral Q8H PRN Laveda AbbeParks, Laurie Britton, NP      . pantoprazole (PROTONIX) EC tablet 40 mg  40 mg Oral Daily Laveda AbbeParks, Laurie Britton, NP   40 mg at 07/29/17 0758  . traZODone (DESYREL) tablet 100 mg  100 mg Oral QHS  Laveda AbbeParks, Laurie Britton, NP   100 mg at 07/28/17 2143  . ziprasidone (GEODON) capsule 80 mg  80 mg Oral BID WC Laveda AbbeParks, Laurie Britton, NP   80 mg at 07/29/17 [id number]   PTA Medications: Medications Prior to Admission  Medication Sig Dispense Refill Last Dose  . albuterol (PROAIR HFA) 108 (90 Base) MCG/ACT inhaler Inhale 2 puffs into the lungs every 6 (six) hours as needed for wheezing or shortness of breath. 1 Inhaler 0 Past Month at Unknown time  . cetirizine (ZYRTEC) 10 MG tablet Take 1 tablet (10 mg total) by mouth daily. 30 tablet 0 07/26/2017 at Unknown time  . clindamycin-benzoyl peroxide (BENZACLIN) gel Apply 1 application topically See admin instructions. Apply thin amount to face every other night (alternate with differin gel)   Past Week at Unknown time  . fluticasone (FLONASE) 50 MCG/ACT nasal spray Place 1 spray into both nostrils daily. (Patient taking differently: Place 1 spray into both nostrils daily as needed for allergies. ) 16 g 0 07/26/2017 at Unknown time  . fluticasone (FLOVENT HFA) 220 MCG/ACT inhaler Inhale 2 puffs into the lungs daily.    07/26/2017 at Unknown time  . gabapentin (NEURONTIN) 300 MG capsule Take 300 mg by mouth 2 (two) times daily.    07/26/2017 at Unknown time  .  guanFACINE (INTUNIV) 2 MG TB24 ER tablet Take 4 mg by mouth daily.   2 07/26/2017 at Unknown time  . montelukast (SINGULAIR) 10 MG tablet Take 1 tablet (10 mg total) by mouth at bedtime. (Patient not taking: Reported on 08/03/2016) 30 tablet 0 Not Taking at Unknown time  . montelukast (SINGULAIR) 5 MG chewable tablet Chew 5 mg by mouth at bedtime.   07/26/2017 at Unknown time  . naproxen (NAPROSYN) 375 MG tablet Take 1 tablet (375 mg total) by mouth 2 (two) times daily. (Patient not taking: Reported on 07/27/2017) 20 tablet 0 Not Taking at Unknown time  . omeprazole (PRILOSEC) 10 MG capsule Take 1 capsule (10 mg total) by mouth daily. (Patient taking differently: Take 10 mg by mouth daily before breakfast. ) 30  capsule 0 07/26/2017 at Unknown time  . traZODone (DESYREL) 100 MG tablet Take 100 mg by mouth at bedtime.   07/26/2017 at Unknown time  . ziprasidone (GEODON) 80 MG capsule Take 80 mg by mouth 2 (two) times daily with a meal.   07/26/2017 at Unknown time    Patient Stressors: Marital or family conflict  Patient Strengths: Wellsite geologist fund of knowledge Physical Health Special hobby/interest Supportive family/friends  Treatment Modalities: Medication Management, Group therapy, Case management,  1 to 1 session with clinician, Psychoeducation, Recreational therapy.   Physician Treatment Plan for Primary Diagnosis: MDD (major depressive disorder), recurrent severe, without psychosis (HCC) Long Term Goal(s): Improvement in symptoms so as ready for discharge Improvement in symptoms so as ready for discharge   Short Term Goals: Ability to identify changes in lifestyle to reduce recurrence of condition will improve Ability to verbalize feelings will improve Ability to disclose and discuss suicidal ideas Ability to demonstrate self-control will improve Ability to identify and develop effective coping behaviors will improve Ability to identify changes in lifestyle to reduce recurrence of condition will improve Ability to maintain clinical measurements within normal limits will improve  Medication Management: Evaluate patient's response, side effects, and tolerance of medication regimen.  Therapeutic Interventions: 1 to 1 sessions, Unit Group sessions and Medication administration.  Evaluation of Outcomes: Progressing  Physician Treatment Plan for Secondary Diagnosis: Principal Problem:   MDD (major depressive disorder), recurrent severe, without psychosis (HCC)  Long Term Goal(s): Improvement in symptoms so as ready for discharge Improvement in symptoms so as ready for discharge   Short Term Goals: Ability to identify changes in lifestyle to reduce recurrence of condition  will improve Ability to verbalize feelings will improve Ability to disclose and discuss suicidal ideas Ability to demonstrate self-control will improve Ability to identify and develop effective coping behaviors will improve Ability to identify changes in lifestyle to reduce recurrence of condition will improve Ability to maintain clinical measurements within normal limits will improve     Medication Management: Evaluate patient's response, side effects, and tolerance of medication regimen.  Therapeutic Interventions: 1 to 1 sessions, Unit Group sessions and Medication administration.  Evaluation of Outcomes: Progressing   RN Treatment Plan for Primary Diagnosis: MDD (major depressive disorder), recurrent severe, without psychosis (HCC) Long Term Goal(s): Knowledge of disease and therapeutic regimen to maintain health will improve  Short Term Goals: Ability to remain free from injury will improve, Ability to verbalize feelings will improve, Ability to disclose and discuss suicidal ideas, Ability to identify and develop effective coping behaviors will improve and Compliance with prescribed medications will improve  Medication Management: RN will administer medications as ordered by provider, will assess and evaluate patient's  response and provide education to patient for prescribed medication. RN will report any adverse and/or side effects to prescribing provider.  Therapeutic Interventions: 1 on 1 counseling sessions, Psychoeducation, Medication administration, Evaluate responses to treatment, Monitor vital signs and CBGs as ordered, Perform/monitor CIWA, COWS, AIMS and Fall Risk screenings as ordered, Perform wound care treatments as ordered.  Evaluation of Outcomes: Progressing   LCSW Treatment Plan for Primary Diagnosis: MDD (major depressive disorder), recurrent severe, without psychosis (HCC) Long Term Goal(s): Safe transition to appropriate next level of care at discharge, Engage  patient in therapeutic group addressing interpersonal concerns.  Short Term Goals: Engage patient in aftercare planning with referrals and resources, Increase social support, Increase ability to appropriately verbalize feelings, Increase emotional regulation, Identify triggers associated with mental health/substance abuse issues and Increase skills for wellness and recovery  Therapeutic Interventions: Assess for all discharge needs, 1 to 1 time with Social worker, Explore available resources and support systems, Assess for adequacy in community support network, Educate family and significant other(s) on suicide prevention, Complete Psychosocial Assessment, Interpersonal group therapy.  Evaluation of Outcomes: Progressing   Progress in Treatment: Attending groups: Yes. Participating in groups: Yes. Hyper-verbal and manic at times. Taking medication as prescribed: Yes. Toleration medication: Yes. Family/Significant other contact made: No, will contact:  mother Patient understands diagnosis: Yes. Discussing patient identified problems/goals with staff: Yes. Medical problems stabilized or resolved: Yes. Denies suicidal/homicidal ideation: No. Patient endorses SI to Clinical research associate. Issues/concerns per patient self-inventory: No.   New problem(s) identified: No, Describe:  CSW continuing to assess  New Short Term/Long Term Goal(s): Patient does not have a goal.   Discharge Plan or Barriers: Continuing to assess, patient may discharge home to mother.  Reason for Continuation of Hospitalization: Anxiety Depression Mania Suicidal ideation  Estimated Length of Stay: Continuing to assess  Attendees: Patient: Leon Montoya 07/29/2017 1:52 PM  Physician: Javier Docker 07/29/2017 1:52 PM  Nursing: Marchelle Folks RN 07/29/2017 1:52 PM  RN Care Manager: 07/29/2017 1:52 PM  Social Worker: Enid Cutter, Social Work Intern 07/29/2017 1:52 PM  Recreational Therapist:  07/29/2017 1:52 PM  Other:  07/29/2017 1:52 PM  Other:   07/29/2017 1:52 PM  Other: 07/29/2017 1:52 PM    Scribe for Treatment Team: Darreld Mclean, Student-Social Work 07/29/2017 1:52 PM

## 2017-07-29 NOTE — Progress Notes (Signed)
Recreation Therapy Notes  Date: 4.3.19 Time: 9:30 a.m. Location: [address] Dayroom   Group Topic: Stress Management   Goal Area(s) Addresses:  Goal 1.1: To reduce stress  -Patient will report feeling a reduction in stress level  -Patient will identify the importance of stress management  -Patient will participate during stress management group treatment     Intervention: Stress Management   Activity: Meditation- Patients were in a peaceful environment with soft lighting enhancing patients mood. Patients listened to a body scan meditation to help decrease tension and stress levels    Education: Stress Management, Discharge Planning.    Education Outcome: Acknowledges edcuation/In group clarification offered/Needs additional education   Clinical Observations/Feedback:: Patient did not attend     Davonne Jarnigan, Recreation Therapy Intern   Maurice [name] 07/29/2017 8:19 AM 

## 2017-07-29 NOTE — BHH Suicide Risk Assessment (Addendum)
[facility] INPATIENT:  Family/Significant Other Suicide Prevention Education  Suicide Prevention Education:  Education Completed; Maurice [name], mother/legal guardian [phone]) has been identified by the patient as the family member/significant other with whom the patient will be residing, and identified as the person(s) who will aid the patient in the event of a mental health crisis (suicidal ideations/suicide attempt).  With written consent from the patient, the family member/significant other has been provided the following suicide prevention education, prior to the and/or following the discharge of the patient.  The suicide prevention education provided includes the following:  Suicide risk factors  Suicide prevention and interventions  National Suicide Hotline telephone number  [facility] assessment telephone number  [facility] Assistance 911  [facility] and/or Residential Mobile Crisis Unit telephone number  Request made of family/significant other to:  Remove weapons (e.g., guns, rifles, knives), all items previously/currently identified as safety concern.    Remove drugs/medications (over-the-counter, prescriptions, illicit drugs), all items previously/currently identified as a safety concern.  The family member/significant other verbalizes understanding of the suicide prevention education information provided.  The family member/significant other agrees to remove the items of safety concern listed above.  Maurice [name] 07/29/2017, 2:30 PM

## 2017-07-29 NOTE — [facility] Group Notes (Signed)
[facility]      07/29/2017 11:19 AM  Type of Therapy: Mental Health Association Presentation  Participation Level: Active  Participation Quality: Attentive  Affect: Appropriate  Cognitive: Oriented  Insight: Developing/Improving  Engagement in Therapy: Engaged  Modes of Intervention: Discussion, Education and Socialization  Summary of Progress/Problems: Mental Health Association (MHA) Speaker came to talk about his personal journey with mental health. The pt processed ways by which to relate to the speaker. MHA speaker provided handouts and educational information pertaining to groups and services offered by the [facility]. Pt was engaged in speaker's presentation and was receptive to resources provided.    Alcario DroughtJolan Sherah Lund LCSWA Clinical Social Worker

## 2017-07-29 NOTE — Progress Notes (Signed)
  DATA ACTION RESPONSE  Objective- Pt. is visible in the dayroom, seen interacting with peers. Presents with an animated/hyperactive     affect and mood. Pt is intrusive and silly in the milieu with poor boundaries.No new c/o's.  Subjective- Denies having any SI/HI/AVH/Pain at this time.Is cooperative and remains safe on the unit.  1:1 interaction in private to establish rapport. Encouragement, education, & support given from staff.  PRN vistrail requested and will re-eval accordingly.   Safety maintained with Q 15 checks. Continue with POC.

## 2017-07-29 NOTE — Progress Notes (Signed)
DAR NOTE: Patient presents with anxious affect and labile mood.  Pt is very hyper active/verbal, observed singing and dancing on the hallway. Pt is intrusive and has poor boundary, observed how he interact with peers and staff. Pt refused his nebulizer treatment stating he has never used it before. Denies pain, auditory and visual hallucinations.  Rates depression at 7, hopelessness at 8, and anxiety at 0.  Maintained on routine safety checks.  Medications given as prescribed.  Support and encouragement offered as needed.  Attended group and participated.  States goal for today is "Music, basketball and depression/anger."  Patient observed socializing with peers in the dayroom.  Offered no complaint.

## 2017-07-29 NOTE — Progress Notes (Signed)
Adult Psychoeducational Group Note  Date:  07/29/2017 Time:  7:25 PM  Group Topic/Focus:  Goals Group:   The focus of this group is to help patients establish daily goals to achieve during treatment and discuss how the patient can incorporate goal setting into their daily lives to aide in recovery.  Participation Level:  Active  Participation Quality:  Appropriate  Affect:  Appropriate  Cognitive:  Alert  Insight: Improving  Engagement in Group:  Engaged  Modes of Intervention:  Discussion  Additional Comments:  Pt attended group and participated in discussions.  Anjannette Gauger R Tramain Gershman 07/29/2017, 7:25 PM

## 2017-07-29 NOTE — Progress Notes (Addendum)
[facility] MD Progress Note  07/29/2017 1:43 PM Maurice [name]  MRN:  [id number]   Subjective:  Patient reports that he did not sleep well last night and he feels that he has been "amped up." He reports that last night he felt shaky, woke up crying, disrupted sleep all night, and weird dreams. He thinks it may be the Intuniv. He denies any SI/HI/AVH and contracts for safety.    Objective: Patient's chart and findings reviewed and discussed with treatment team. Patient presents on the unit and appears hyper, sits for very short periods of time, staff report he interrupts group, hyperverbal, and he has been presenting manic to other staff. Patient is pleasant and cooperative. He agrees to stop the Intuniv. Will continue all other medications.    Principal Problem: MDD (major depressive disorder), recurrent severe, without psychosis (HCC) Diagnosis:   Patient Active Problem List   Diagnosis Date Noted  . MDD (major depressive disorder), recurrent severe, without psychosis (HCC) [F33.2] 07/28/2017  . Aggressive behavior [R46.89] 11/30/2015  . Hypertension [I10]   . Intentional overdose of drug in tablet form (HCC) [T50.902A]   . Other secondary hypertension [I15.8]   . Somnolence [R40.0]   . Overdose [T50.901A] 11/09/2015  . GERD (gastroesophageal reflux disease) [K21.9] 07/20/2015  . Appetite lost [R63.0] 07/20/2015  . Suicidal ideation [R45.851]   . DMDD (disruptive mood dysregulation disorder) (HCC) [F34.81] 03/13/2015  . Homicidal ideations [R45.850] 03/13/2015  . Intellectual disability [F79] 02/26/2015  . Attention-deficit hyperactivity disorder, predominantly hyperactive type [F90.1]   . ADHD (attention deficit hyperactivity disorder), combined type [F90.2] 01/31/2015  . Oppositional defiant disorder [F91.3] 01/31/2015  . Mood disorder (HCC) [F39] 01/31/2015   Total Time spent with patient: 15 minutes  Past Psychiatric History: See H&P  Past Medical History:  Past Medical History:   Diagnosis Date  . ADHD (attention deficit hyperactivity disorder)   . Allergy   . Appetite lost 07/20/2015  . Asthma   . GERD (gastroesophageal reflux disease) 07/20/2015  . Homicidal ideations   . Intellectual disability 02/26/2015  . ODD (oppositional defiant disorder)   . Suicidal ideations    History reviewed. No pertinent surgical history. Family History:  Family History  Adopted: Yes   Family Psychiatric  History: See H&P Social History:  Social History   Substance and Sexual Activity  Alcohol Use Yes   Comment: I mix henessy with gatorade     Social History   Substance and Sexual Activity  Drug Use No    Social History   Socioeconomic History  . Marital status: Single    Spouse name: Not on file  . Number of children: Not on file  . Years of education: Not on file  . Highest education level: Not on file  Occupational History  . Not on file  Social Needs  . Financial resource strain: Not on file  . Food insecurity:    Worry: Not on file    Inability: Not on file  . Transportation needs:    Medical: Not on file    Non-medical: Not on file  Tobacco Use  . Smoking status: Never Smoker  . Smokeless tobacco: Never Used  Substance and Sexual Activity  . Alcohol use: Yes    Comment: I mix henessy with gatorade  . Drug use: No  . Sexual activity: Yes    Birth control/protection: Condom  Lifestyle  . Physical activity:    Days per week: Not on file    Minutes per session: Not  on file  . Stress: Not on file  Relationships  . Social connections:    Talks on phone: Not on file    Gets together: Not on file    Attends religious service: Not on file    Active member of club or organization: Not on file    Attends meetings of clubs or organizations: Not on file    Relationship status: Not on file  Other Topics Concern  . Not on file  Social History Narrative  . Not on file   Additional Social History:                         Sleep:  Poor  Appetite:  Fair  Current Medications: Current Facility-Administered Medications  Medication Dose Route Frequency Provider Last Rate Last Dose  . acetaminophen (TYLENOL) tablet 650 mg  650 mg Oral Q6H PRN Laveda AbbeParks, Laurie Britton, NP      . albuterol (PROVENTIL HFA;VENTOLIN HFA) 108 (90 Base) MCG/ACT inhaler 2 puff  2 puff Inhalation Q6H PRN Laveda AbbeParks, Laurie Britton, NP      . alum & mag hydroxide-simeth (MAALOX/MYLANTA) 200-200-20 MG/5ML suspension 30 mL  30 mL Oral Q4H PRN Laveda AbbeParks, Laurie Britton, NP      . budesonide (PULMICORT) nebulizer solution 0.5 mg  0.5 mg Nebulization BID Laveda AbbeParks, Laurie Britton, NP      . fluticasone [facility](FLONASE) 50 MCG/ACT nasal spray 1 spray  1 spray Each Nare Daily PRN Laveda AbbeParks, Laurie Britton, NP      . gabapentin (NEURONTIN) capsule 300 mg  300 mg Oral BID Laveda AbbeParks, Laurie Britton, NP   300 mg at 07/29/17 0758  . hydrOXYzine (ATARAX/VISTARIL) tablet 25 mg  25 mg Oral TID PRN Laveda AbbeParks, Laurie Britton, NP      . loratadine (CLARITIN) tablet 10 mg  10 mg Oral Daily Laveda AbbeParks, Laurie Britton, NP   10 mg at 07/29/17 0758  . LORazepam (ATIVAN) tablet 0.5 mg  0.5 mg Oral Q6H PRN Kristoffer Bala, Rockey SituFernando A, MD   0.5 mg at 07/29/17 0058  . magnesium hydroxide (MILK OF MAGNESIA) suspension 30 mL  30 mL Oral Daily PRN Laveda AbbeParks, Laurie Britton, NP      . montelukast (SINGULAIR) chewable tablet 5 mg  5 mg Oral QHS Laveda AbbeParks, Laurie Britton, NP      . ondansetron [facility](ZOFRAN) tablet 4 mg  4 mg Oral Q8H PRN Laveda AbbeParks, Laurie Britton, NP      . pantoprazole (PROTONIX) EC tablet 40 mg  40 mg Oral Daily Laveda AbbeParks, Laurie Britton, NP   40 mg at 07/29/17 0758  . traZODone (DESYREL) tablet 100 mg  100 mg Oral QHS Laveda AbbeParks, Laurie Britton, NP   100 mg at 07/28/17 2143  . ziprasidone (GEODON) capsule 80 mg  80 mg Oral BID WC Laveda AbbeParks, Laurie Britton, NP   80 mg at 07/29/17 [id number]    Lab Results:  Results for orders placed or performed during the hospital encounter of 07/28/17 (from the past 48 hour(s))  Lipid panel     Status: None    Collection Time: 07/29/17  6:49 AM  Result Value Ref Range   Cholesterol 156 0 - 200 mg/dL   Triglycerides 50 <960<150 mg/dL   HDL 62 >45>40 mg/dL   Total CHOL/HDL Ratio 2.5 RATIO   VLDL 10 0 - 40 mg/dL   LDL Cholesterol 84 0 - 99 mg/dL    Comment:        Total Cholesterol/HDL:CHD Risk Coronary Heart Disease Risk Table  Men   Women  1/2 Average Risk   3.4   3.3  Average Risk       5.0   4.4  2 X Average Risk   9.6   7.1  3 X Average Risk  23.4   11.0        Use the calculated Patient Ratio above and the CHD Risk Table to determine the patient's CHD Risk.        ATP III CLASSIFICATION (LDL):  <100     mg/dL   Optimal  161-096  mg/dL   Near or Above                    Optimal  130-159  mg/dL   Borderline  045-409  mg/dL   High  >811     mg/dL   Very High Performed at [facility], [address] [place], [place] [address]   Hemoglobin A1c     Status: Abnormal   Collection Time: 07/29/17  6:49 AM  Result Value Ref Range   Hgb A1c MFr Bld 5.8 (H) 4.8 - 5.6 %    Comment: (NOTE) Pre diabetes:          5.7%-6.4% Diabetes:              >6.4% Glycemic control for   <7.0% adults with diabetes    Mean Plasma Glucose 119.76 mg/dL    Comment: Performed at [facility], [address], [place], [place] [address]  TSH     Status: Abnormal   Collection Time: 07/29/17  6:49 AM  Result Value Ref Range   TSH 4.508 (H) 0.350 - 4.500 uIU/mL    Comment: Performed by a 3rd Generation assay with a functional sensitivity of <=0.01 uIU/mL. Performed at [facility], [address] [place], [place] [address]     Blood Alcohol level:  Lab Results  Component Value Date   [facility] <10 07/27/2017   ETH <5 08/03/2016    Metabolic Disorder Labs: Lab Results  Component Value Date   HGBA1C 5.8 (H) 07/29/2017   MPG 119.76 07/29/2017   No results found for: PROLACTIN Lab Results  Component Value Date   CHOL 156 07/29/2017   TRIG 50  07/29/2017   HDL 62 07/29/2017   CHOLHDL 2.5 07/29/2017   VLDL 10 07/29/2017   LDLCALC 84 07/29/2017    Physical Findings: AIMS: Facial and Oral Movements Muscles of Facial Expression: None, normal Lips and Perioral Area: None, normal Jaw: None, normal Tongue: None, normal,Extremity Movements Upper (arms, wrists, hands, fingers): None, normal Lower (legs, knees, ankles, toes): None, normal, Trunk Movements Neck, shoulders, hips: None, normal, Overall Severity Severity of abnormal movements (highest score from questions above): None, normal Incapacitation due to abnormal movements: None, normal Patient's awareness of abnormal movements (rate only patient's report): No Awareness, Dental Status Current problems with teeth and/or dentures?: No Does patient usually wear dentures?: No  CIWA:    COWS:     Musculoskeletal: Strength & Muscle Tone: within normal limits Gait & Station: normal Patient leans: N/A  Psychiatric Specialty Exam: Physical Exam  ROS  Blood pressure 135/70, pulse (!) 111, temperature 97.8 F (36.6 C), temperature source Oral, resp. rate 17, height 5\' 7"  (1.702 m), weight 67.1 kg (148 lb).Body mass index is 23.18 kg/m.  General Appearance: Casual  Eye Contact:  Good  Speech:  Pressured  Volume:  Increased  Mood:  Euphoric but reports feeling depressed at  interview  Affect:  Labile  Thought Process:  Goal Directed and Descriptions of Associations: Intact  Orientation:  Full (Time, Place, and Person)  Thought Content:  WDL  Suicidal Thoughts:  No  Homicidal Thoughts:  No  Memory:  Immediate;   Good Recent;   Good Remote;   Good  Judgement:  Fair  Insight:  Fair  Psychomotor Activity:  Normal  Concentration:  Concentration: Good and Attention Span: Good  Recall:  Good  Fund of Knowledge:  Good  Language:  Good  Akathisia:  No  Handed:  Right  AIMS (if indicated):     Assets:  Communication Skills Desire for Improvement Financial  Resources/Insurance Housing Physical Health Social Support Transportation  ADL's:  Intact  Cognition:  WNL  Sleep:  Number of Hours: 2.25   Problems Addressed: MDD severe  Treatment Plan Summary: Daily contact with patient to assess and evaluate symptoms and progress in treatment, Medication management and Plan is to:  -Stop Intuniv -Continue Geodon 80 mg PO BIDWC for mood stability -Continue Neurontin 300 mg TID for anxiety -Continue Vistaril 25 mg PO Q6H PRN for anxiety -Continue Ativan 0.5 mg PO Q6H PRN for anxiety -Encourage group therapy participation  Maryfrances Bunnell, [name] 07/29/2017, 1:43 PM   Agree with NP Progress Note

## 2017-07-30 MED ORDER — GABAPENTIN 100 MG PO CAPS
200.0000 mg | ORAL_CAPSULE | Freq: Two times a day (BID) | ORAL | Status: DC
  Administered 2017-07-30 – 2017-08-02 (×6): 200 mg via ORAL
  Filled 2017-07-30 (×11): qty 2

## 2017-07-30 MED ORDER — TRAZODONE HCL 50 MG PO TABS
50.0000 mg | ORAL_TABLET | Freq: Every day | ORAL | Status: DC
  Administered 2017-07-30: 50 mg via ORAL
  Filled 2017-07-30 (×3): qty 1

## 2017-07-30 MED ORDER — MONTELUKAST SODIUM 5 MG PO CHEW
5.0000 mg | CHEWABLE_TABLET | ORAL | Status: DC
  Administered 2017-07-31 – 2017-08-13 (×14): 5 mg via ORAL
  Filled 2017-07-30 (×16): qty 1

## 2017-07-30 NOTE — Progress Notes (Signed)
DAR NOTE: Patient presents with anxious affect and irritable mood.  Denies suicidal thoughts, pain, auditory and visual hallucinations.  Rates depression at 9, hopelessness at 0, and anxiety at 0.  Maintained on routine safety checks.  Medications given as prescribed.  Support and encouragement offered as needed. Patient visible in the dayroom interacting with peers.  Patient is safe on the unit.

## 2017-07-30 NOTE — Progress Notes (Addendum)
[facility] MD Progress Note  07/30/2017 5:02 PM PLACIDO HANGARTNER  MRN:  [id number]   Subjective: Patient states his mood has been variable but endorses improvement compared to before admission.  At present he states he does not think he wants to go back to live with his mother after discharge, "because we argue a lot". Denies medication side effects. Denies suicidal ideations.  Objective: I have discussed the case with treatment team and have met with patient. Report from staff is that patient continues to present with anxiety, affective lability, requiring redirections /limit setting .  He has not had any overt explosiveness or angry outburst. Patient states he has had good conversations via phone with his mother.  He requests that I call her for collateral information. Mother corroborates patient has a history of explosiveness/angry outbursts, and states he has also been diagnosed with ADHD .  We reviewed medications-she states that he has been on Depakote in the past but that it caused  significant weight gain.  Also reports that patient had recently been started on Luvox for "obsessions", and had most recently been taking 100 mg a day. As above, although patient reports missing his mother and having good phone conversations with her he also states he is hoping he will be able to go to another setting after discharge, because he feels he often argues with his mother. They have both expressed interest in a family meeting.  Intuniv was discontinued as it can be associated with affect of lability and irritability.  I have discussed fluvoxamine management with patient, at this time he states he is uncertain if it was helping but does not remember having had any side effects.  He does report he is reluctant to take any medications frequently associated with weight gain and as above states Depakote caused significant weight gain in the past.  He feels Geodon does help. Of note, 4/2 EKG QTc 379    Principal  Problem: MDD (major depressive disorder), recurrent severe, without psychosis ([place]) Diagnosis:   Patient Active Problem List   Diagnosis Date Noted  . MDD (major depressive disorder), recurrent severe, without psychosis ([place]) [F33.2] 07/28/2017  . Aggressive behavior [R46.89] 11/30/2015  . Hypertension [I10]   . Intentional overdose of drug in tablet form ([place]) [T50.902A]   . Other secondary hypertension [I15.8]   . Somnolence [R40.0]   . Overdose [T50.901A] 11/09/2015  . GERD (gastroesophageal reflux disease) [K21.9] 07/20/2015  . Appetite lost [R63.0] 07/20/2015  . Suicidal ideation [R45.851]   . DMDD (disruptive mood dysregulation disorder) ([place]) [F34.81] 03/13/2015  . Homicidal ideations [R45.850] 03/13/2015  . Intellectual disability [F79] 02/26/2015  . Attention-deficit hyperactivity disorder, predominantly hyperactive type [F90.1]   . ADHD (attention deficit hyperactivity disorder), combined type [F90.2] 01/31/2015  . Oppositional defiant disorder [F91.3] 01/31/2015  . Mood disorder ([place]) [F39] 01/31/2015   Total Time spent with patient: 20 minutes  Past Psychiatric History: See H&P  Past Medical History:  Past Medical History:  Diagnosis Date  . ADHD (attention deficit hyperactivity disorder)   . Allergy   . Appetite lost 07/20/2015  . Asthma   . GERD (gastroesophageal reflux disease) 07/20/2015  . Homicidal ideations   . Intellectual disability 02/26/2015  . ODD (oppositional defiant disorder)   . Suicidal ideations    History reviewed. No pertinent surgical history. Family History:  Family History  Adopted: Yes   Family Psychiatric  History: See H&P Social History:  Social History   Substance and Sexual Activity  Alcohol Use  Yes   Comment: I mix henessy with gatorade     Social History   Substance and Sexual Activity  Drug Use No    Social History   Socioeconomic History  . Marital status: Single    Spouse name: Not on file  . Number of children:  Not on file  . Years of education: Not on file  . Highest education level: Not on file  Occupational History  . Not on file  Social Needs  . Financial resource strain: Not on file  . Food insecurity:    Worry: Not on file    Inability: Not on file  . Transportation needs:    Medical: Not on file    Non-medical: Not on file  Tobacco Use  . Smoking status: Never Smoker  . Smokeless tobacco: Never Used  Substance and Sexual Activity  . Alcohol use: Yes    Comment: I mix henessy with gatorade  . Drug use: No  . Sexual activity: Yes    Birth control/protection: Condom  Lifestyle  . Physical activity:    Days per week: Not on file    Minutes per session: Not on file  . Stress: Not on file  Relationships  . Social connections:    Talks on phone: Not on file    Gets together: Not on file    Attends religious service: Not on file    Active member of club or organization: Not on file    Attends meetings of clubs or organizations: Not on file    Relationship status: Not on file  Other Topics Concern  . Not on file  Social History Narrative  . Not on file   Additional Social History:   Sleep: Fair  Appetite:  Good  Current Medications: Current Facility-Administered Medications  Medication Dose Route Frequency Provider Last Rate Last Dose  . acetaminophen (TYLENOL) tablet 650 mg  650 mg Oral Q6H PRN Ethelene Hal, NP      . albuterol (PROVENTIL HFA;VENTOLIN HFA) 108 (90 Base) MCG/ACT inhaler 2 puff  2 puff Inhalation Q6H PRN Ethelene Hal, NP      . alum & mag hydroxide-simeth (MAALOX/MYLANTA) 200-200-20 MG/5ML suspension 30 mL  30 mL Oral Q4H PRN Ethelene Hal, NP      . budesonide (PULMICORT) nebulizer solution 0.5 mg  0.5 mg Nebulization BID Ethelene Hal, NP      . fluticasone [facility]) 50 MCG/ACT nasal spray 1 spray  1 spray Each Nare Daily PRN Ethelene Hal, NP      . gabapentin (NEURONTIN) capsule 200 mg  200 mg Oral BID Cobos,  Myer Peer, MD      . hydrOXYzine (ATARAX/VISTARIL) tablet 25 mg  25 mg Oral TID PRN Ethelene Hal, NP   25 mg at 07/29/17 2153  . loratadine (CLARITIN) tablet 10 mg  10 mg Oral Daily Ethelene Hal, NP   10 mg at 07/30/17 1540  . LORazepam (ATIVAN) tablet 0.5 mg  0.5 mg Oral Q6H PRN Cobos, Myer Peer, MD   0.5 mg at 07/29/17 0058  . magnesium hydroxide (MILK OF MAGNESIA) suspension 30 mL  30 mL Oral Daily PRN Ethelene Hal, NP      . Derrill Memo ON 07/31/2017] montelukast (SINGULAIR) chewable tablet 5 mg  5 mg Oral [address], Lowry Ram, [name]      . ondansetron [facility]) tablet 4 mg  4 mg Oral Q8H PRN Ethelene Hal, NP      .  pantoprazole (PROTONIX) EC tablet 40 mg  40 mg Oral Daily Ethelene Hal, NP   40 mg at 07/30/17 2725  . traZODone (DESYREL) tablet 50 mg  50 mg Oral QHS Cobos, Fernando A, MD      . ziprasidone (GEODON) capsule 80 mg  80 mg Oral BID WC Ethelene Hal, NP   80 mg at 07/30/17 3664    Lab Results:  Results for orders placed or performed during the hospital encounter of 07/28/17 (from the past 48 hour(s))  Lipid panel     Status: None   Collection Time: 07/29/17  6:49 AM  Result Value Ref Range   Cholesterol 156 0 - 200 mg/dL   Triglycerides 50 <150 mg/dL   HDL 62 >40 mg/dL   Total CHOL/HDL Ratio 2.5 RATIO   VLDL 10 0 - 40 mg/dL   LDL Cholesterol 84 0 - 99 mg/dL    Comment:        Total Cholesterol/HDL:CHD Risk Coronary Heart Disease Risk Table                     Men   Women  1/2 Average Risk   3.4   3.3  Average Risk       5.0   4.4  2 X Average Risk   9.6   7.1  3 X Average Risk  23.4   11.0        Use the calculated Patient Ratio above and the CHD Risk Table to determine the patient's CHD Risk.        ATP III CLASSIFICATION (LDL):  <100     mg/dL   Optimal  100-129  mg/dL   Near or Above                    Optimal  130-159  mg/dL   Borderline  160-189  mg/dL   High  >190     mg/dL   Very High Performed at [facility] [address], [place], [place] [address]   Hemoglobin A1c     Status: Abnormal   Collection Time: 07/29/17  6:49 AM  Result Value Ref Range   Hgb A1c MFr Bld 5.8 (H) 4.8 - 5.6 %    Comment: (NOTE) Pre diabetes:          5.7%-6.4% Diabetes:              >6.4% Glycemic control for   <7.0% adults with diabetes    Mean Plasma Glucose 119.76 mg/dL    Comment: Performed at [facility] [address], [place], [place] [address]  TSH     Status: Abnormal   Collection Time: 07/29/17  6:49 AM  Result Value Ref Range   TSH 4.508 (H) 0.350 - 4.500 uIU/mL    Comment: Performed by a 3rd Generation assay with a functional sensitivity of <=0.01 uIU/mL. Performed at [facility], [place] [address], [place], [place] [address]     Blood Alcohol level:  Lab Results  Component Value Date   [facility] <10 07/27/2017   ETH <5 64/33/2951    Metabolic Disorder Labs: Lab Results  Component Value Date   HGBA1C 5.8 (H) 07/29/2017   MPG 119.76 07/29/2017   No results found for: PROLACTIN Lab Results  Component Value Date   CHOL 156 07/29/2017   TRIG 50 07/29/2017   HDL 62 07/29/2017   CHOLHDL 2.5 07/29/2017   VLDL 10  07/29/2017   [place] 84 07/29/2017    Physical Findings: AIMS: Facial and Oral Movements Muscles of Facial Expression: None, normal Lips and Perioral Area: None, normal Jaw: None, normal Tongue: None, normal,Extremity Movements Upper (arms, wrists, hands, fingers): None, normal Lower (legs, knees, ankles, toes): None, normal, Trunk Movements Neck, shoulders, hips: None, normal, Overall Severity Severity of abnormal movements (highest score from questions above): None, normal Incapacitation due to abnormal movements: None, normal Patient's awareness of abnormal movements (rate only patient's report): No Awareness, Dental Status Current problems with teeth and/or dentures?: No Does patient usually wear dentures?: No  CIWA:     COWS:     Musculoskeletal: Strength & Muscle Tone: within normal limits Gait & Station: normal Patient leans: N/A  Psychiatric Specialty Exam: Physical Exam  ROS no chest pain, no shortness of breath, no vomiting  Blood pressure (!) 128/54, pulse 94, temperature 97.6 F (36.4 C), temperature source Oral, resp. rate 17, height _0  (1.702 m), weight 67.1 kg (148 lb).Body mass index is 23.18 kg/m.  General Appearance: Improving grooming  Eye Contact:  Fair  Speech:  Normal Rate  Volume:  Normal  Mood:  Reports his mood is "the same".  Presents vaguely depressed but reports feeling depressed at interview  Affect:  Labile and At present not irritable or explosive or expansive  Thought Process:  Linear and Descriptions of Associations: Intact  Orientation:  Full (Time, Place, and Person)  Thought Content:  No hallucinations, no delusions expressed, does not appear internally preoccupied  Suicidal Thoughts:  No denies suicidal ideations at this time, denies self-injurious ideations, contracts for safety on unit, denies homicidal ideations, also denies any violent ideations towards mother  Homicidal Thoughts:  No  Memory:  Recent and remote grossly intact  Judgement:  Fair  Insight:  Fair  Psychomotor Activity:  Normal  Concentration:  Concentration: Good and Attention Span: Good  Recall:  Good  Fund of Knowledge:  Good  Language:  Good  Akathisia:  No  Handed:  Right  AIMS (if indicated):     Assets:  Communication Skills Desire for Improvement Financial Resources/Insurance Housing Physical Health Social Support Transportation  ADL's:  Intact  Cognition:  WNL  Sleep:  Number of Hours: 5.75   Assessment -22 year old single male who lives with adoptive mother.  Presented for depression and making suicidal threats during an argument with mother.  Information from his mother and patient suggestive of intermittent explosive disorder.  He has also been diagnosed with ADHD.  On  unit patient has been intermittently labile, intrusive, but has not exhibited any explosiveness.  Currently he is future oriented and is focusing on discharge options.  He states he is hoping he will be able to discharge to a setting different from returning home to mother as he states  he has frequent arguments with her. Regarding medication management- We have considered Depakote and Zyprexa for management of impulsivity and angry outbursts but patient reports that Depakote caused significant weight gain in the past and not interested in Zyprexa due to weight gain potential.  He does feel Geodon, now at 80 mg twice daily, is helping.  He was taken off Intuniv as this medication may be associated with irritability and affective lability.  He reports Neurontin was started recently, but he does not think it is helping. We also reviewed Wellbutrin XL as a treatment option for depression and to help manage ADHD.   Treatment Plan Summary: Treatment plan reviewed as below today 4/4 Daily  contact with patient to assess and evaluate symptoms and progress in treatment, Medication management and Plan is to:  -Encourage group participation to work on Radiographer, therapeutic and symptom reduction -Treatment team working on disposition plans -Patient and mother are interested in family meeting, will discuss with Education officer, museum to arrange . -Continue Geodon 80 mg PO BID WC for mood disorder/impulsivity -Decrease  Neurontin 200 mg TID for anxiety- consider gradual taper  -Continue Ativan 0.5 mg PO Q6H PRN for anxiety -Decrease Trazodone to 50 mgrs QHS PRN for insomnia as needed    Jenne Campus, MD 07/30/2017, 5:02 PM   Patient ID: Calton Golds, male   DOB: 1995-10-25, 22 y.o.   MRN: [id number]

## 2017-07-30 NOTE — [facility] Group Notes (Signed)
[facility] LCSW Group Therapy Note  Date/Time  07/30/17  1:15pm  Type of Therapy/Topic:  Group Therapy:  Balance in Life  Participation Level:  None- Did not attend  Description of Group:    This group will address the concept of balance and how it feels and looks when one is unbalanced. Patients will be encouraged to process areas in their lives that are out of balance, and identify reasons for remaining unbalanced. Facilitators will guide patients utilizing problem- solving interventions to address and correct the stressor making their life unbalanced. Understanding and applying boundaries will be explored and addressed for obtaining  and maintaining a balanced life. Patients will be encouraged to explore ways to assertively make their unbalanced needs known to significant others in their lives, using other group members and facilitator for support and feedback.  Therapeutic Goals: 1. Patient will identify two or more emotions or situations they have that consume much of in their lives. 2. Patient will identify signs/triggers that life has become out of balance:  3. Patient will identify two ways to set boundaries in order to achieve balance in their lives:  4. Patient will demonstrate ability to communicate their needs through discussion and/or role plays  Summary of Patient Progress: Patient did not attend group.    Therapeutic Modalities:   Cognitive Behavioral Therapy Solution-Focused Therapy Assertiveness Training  

## 2017-07-31 DIAGNOSIS — G4700 Insomnia, unspecified: Secondary | ICD-10-CM

## 2017-07-31 DIAGNOSIS — R451 Restlessness and agitation: Secondary | ICD-10-CM

## 2017-07-31 MED ORDER — BUPROPION HCL ER (XL) 150 MG PO TB24
150.0000 mg | ORAL_TABLET | Freq: Every day | ORAL | Status: DC
  Administered 2017-07-31 – 2017-08-01 (×2): 150 mg via ORAL
  Filled 2017-07-31 (×5): qty 1

## 2017-07-31 MED ORDER — TRAZODONE HCL 50 MG PO TABS
50.0000 mg | ORAL_TABLET | Freq: Every evening | ORAL | Status: DC | PRN
  Administered 2017-07-31 – 2017-08-12 (×12): 50 mg via ORAL
  Filled 2017-07-31 (×11): qty 1

## 2017-07-31 NOTE — Progress Notes (Addendum)
[facility]  07/31/2017 4:03 PM Maurice [name]  MRN:  [id number]    Subjective:  Patient reports that he is feeling depressed. "I know I am talking to everybody, but I feel depressed and feel like I need a medicine for it." He reports that he feels better off of the Intuniv, but his mom is always pushing for him to be on the Intuniv. He also reports that he can control a lot of his behavior, but he just doesn't want to sometimes. He denies any SI/HI/AVH and contracts for safety.  Objective: Patient's chart and findings reviewed and discussed with treatment team. Patient presents in the day room and has been interacting with peers and staff. He is still appearing hyper and intrusive, but greatly reduced now that he is off of the Intuniv. Discussed medications and will start Wellbutrin XL 150 mg Daily and titrate as necessary.  Principal Problem: MDD (major depressive disorder), recurrent severe, without psychosis (HCC) Diagnosis:   Patient Active Problem List   Diagnosis Date Noted  . MDD (major depressive disorder), recurrent severe, without psychosis (HCC) [F33.2] 07/28/2017  . Aggressive behavior [R46.89] 11/30/2015  . Hypertension [I10]   . Intentional overdose of drug in tablet form (HCC) [T50.902A]   . Other secondary hypertension [I15.8]   . Somnolence [R40.0]   . Overdose [T50.901A] 11/09/2015  . GERD (gastroesophageal reflux disease) [K21.9] 07/20/2015  . Appetite lost [R63.0] 07/20/2015  . Suicidal ideation [R45.851]   . DMDD (disruptive mood dysregulation disorder) (HCC) [F34.81] 03/13/2015  . Homicidal ideations [R45.850] 03/13/2015  . Intellectual disability [F79] 02/26/2015  . Attention-deficit hyperactivity disorder, predominantly hyperactive type [F90.1]   . ADHD (attention deficit hyperactivity disorder), combined type [F90.2] 01/31/2015  . Oppositional defiant disorder [F91.3] 01/31/2015  . Mood disorder (HCC) [F39] 01/31/2015   Total Time spent with patient: 30  minutes  Past Psychiatric History: See H&P  Past Medical History:  Past Medical History:  Diagnosis Date  . ADHD (attention deficit hyperactivity disorder)   . Allergy   . Appetite lost 07/20/2015  . Asthma   . GERD (gastroesophageal reflux disease) 07/20/2015  . Homicidal ideations   . Intellectual disability 02/26/2015  . ODD (oppositional defiant disorder)   . Suicidal ideations    History reviewed. No pertinent surgical history. Family History:  Family History  Adopted: Yes   Family Psychiatric  History: See H&P Social History:  Social History   Substance and Sexual Activity  Alcohol Use Yes   Comment: I mix henessy with gatorade     Social History   Substance and Sexual Activity  Drug Use No    Social History   Socioeconomic History  . Marital status: Single    Spouse name: Not on file  . Number of children: Not on file  . Years of education: Not on file  . Highest education level: Not on file  Occupational History  . Not on file  Social Needs  . Financial resource strain: Not on file  . Food insecurity:    Worry: Not on file    Inability: Not on file  . Transportation needs:    Medical: Not on file    Non-medical: Not on file  Tobacco Use  . Smoking status: Never Smoker  . Smokeless tobacco: Never Used  Substance and Sexual Activity  . Alcohol use: Yes    Comment: I mix henessy with gatorade  . Drug use: No  . Sexual activity: Yes    Birth control/protection: Condom  Lifestyle  . Physical activity:    Days per week: Not on file    Minutes per session: Not on file  . Stress: Not on file  Relationships  . Social connections:    Talks on phone: Not on file    Gets together: Not on file    Attends religious service: Not on file    Active member of club or organization: Not on file    Attends meetings of clubs or organizations: Not on file    Relationship status: Not on file  Other Topics Concern  . Not on file  Social History Narrative  .  Not on file   Additional Social History:                         Sleep: Fair  Appetite:  Good  Current Medications: Current Facility-Administered Medications  Medication Dose Route Frequency Provider Last Rate Last Dose  . acetaminophen (TYLENOL) tablet 650 mg  650 mg Oral Q6H PRN Laveda Abbe, NP      . albuterol (PROVENTIL HFA;VENTOLIN HFA) 108 (90 Base) MCG/ACT inhaler 2 puff  2 puff Inhalation Q6H PRN Laveda Abbe, NP      . alum & mag hydroxide-simeth (MAALOX/MYLANTA) 200-200-20 MG/5ML suspension 30 mL  30 mL Oral Q4H PRN Laveda Abbe, NP      . budesonide (PULMICORT) nebulizer solution 0.5 mg  0.5 mg Nebulization BID Laveda Abbe, NP      . buPROPion (WELLBUTRIN XL) 24 hr tablet 150 mg  150 mg Oral Daily Money, Gerlene Burdock, [place]      . fluticasone (FLONASE) 50 MCG/ACT nasal spray 1 spray  1 spray Each Nare Daily PRN Laveda Abbe, NP      . gabapentin (NEURONTIN) capsule 200 mg  200 mg Oral BID Marliss Buttacavoli, Rockey Situ, MD   200 mg at 07/31/17 0803  . hydrOXYzine (ATARAX/VISTARIL) tablet 25 mg  25 mg Oral TID PRN Laveda Abbe, NP   25 mg at 07/30/17 2250  . loratadine (CLARITIN) tablet 10 mg  10 mg Oral Daily Laveda Abbe, NP   10 mg at 07/31/17 1610  . LORazepam (ATIVAN) tablet 0.5 mg  0.5 mg Oral Q6H PRN Emari Demmer, Rockey Situ, MD   0.5 mg at 07/29/17 0058  . magnesium hydroxide (MILK OF MAGNESIA) suspension 30 mL  30 mL Oral Daily PRN Laveda Abbe, NP      . montelukast (SINGULAIR) chewable tablet 5 mg  5 mg Oral [address] Gerlene Burdock, [place]   5 mg at 07/31/17 [phone]  . ondansetron (ZOFRAN) tablet 4 mg  4 mg Oral Q8H PRN Laveda Abbe, NP      . pantoprazole (PROTONIX) EC tablet 40 mg  40 mg Oral Daily Laveda Abbe, NP   40 mg at 07/31/17 0803  . traZODone (DESYREL) tablet 50 mg  50 mg Oral QHS Torri Langston, Rockey Situ, MD   50 mg at 07/30/17 2249  . ziprasidone (GEODON) capsule 80 mg  80 mg Oral BID WC Laveda Abbe, NP   80 mg at 07/31/17 5409    Lab Results: No results found for this or any previous visit (from the past 48 hour(s)).  Blood Alcohol level:  Lab Results  Component Value Date   [facility] <10 07/27/2017   ETH <5 08/03/2016    Metabolic Disorder Labs: Lab Results  Component Value Date   HGBA1C 5.8 (H) 07/29/2017  MPG 119.76 07/29/2017   No results found for: PROLACTIN Lab Results  Component Value Date   CHOL 156 07/29/2017   TRIG 50 07/29/2017   HDL 62 07/29/2017   CHOLHDL 2.5 07/29/2017   VLDL 10 07/29/2017   LDLCALC 84 07/29/2017    Physical Findings: AIMS: Facial and Oral Movements Muscles of Facial Expression: None, normal Lips and Perioral Area: None, normal Jaw: None, normal Tongue: None, normal,Extremity Movements Upper (arms, wrists, hands, fingers): None, normal Lower (legs, knees, ankles, toes): None, normal, Trunk Movements Neck, shoulders, hips: None, normal, Overall Severity Severity of abnormal movements (highest score from questions above): None, normal Incapacitation due to abnormal movements: None, normal Patient's awareness of abnormal movements (rate only patient's report): No Awareness, Dental Status Current problems with teeth and/or dentures?: No Does patient usually wear dentures?: No  CIWA:    COWS:     Musculoskeletal: Strength & Muscle Tone: within normal limits Gait & Station: normal Patient leans: N/A  Psychiatric Specialty Exam: Physical Exam  Nursing note and vitals reviewed. Constitutional: He is oriented to person, place, and time. He appears well-developed and well-nourished.  Cardiovascular: Normal rate.  Respiratory: Effort normal.  Musculoskeletal: Normal range of motion.  Neurological: He is alert and oriented to person, place, and time.  Skin: Skin is warm.    Review of Systems  Constitutional: Negative.   HENT: Negative.   Eyes: Negative.   Respiratory: Negative.   Cardiovascular: Negative.    Gastrointestinal: Negative.   Genitourinary: Negative.   Musculoskeletal: Negative.   Skin: Negative.   Neurological: Negative.   Endo/Heme/Allergies: Negative.   Psychiatric/Behavioral: Negative.     Blood pressure (!) 146/74, pulse (!) 111, temperature 98.6 F (37 C), temperature source Oral, resp. rate 18, height 5\' 7"  (1.702 m), weight 67.1 kg (148 lb).Body mass index is 23.18 kg/m.  General Appearance: Casual  Eye Contact:  Good  Speech:  Clear and Coherent and Normal Rate  Volume:  Normal  Mood:  Depressed  Affect:  Flat  Thought Process:  Goal Directed and Descriptions of Associations: Intact  Orientation:  Full (Time, Place, and Person)  Thought Content:  WDL  Suicidal Thoughts:  No  Homicidal Thoughts:  No  Memory:  Immediate;   Good Recent;   Good Remote;   Good  Judgement:  Fair  Insight:  Fair  Psychomotor Activity:  Normal  Concentration:  Concentration: Good and Attention Span: Good  Recall:  Good  Fund of Knowledge:  Good  Language:  Good  Akathisia:  No  Handed:  Right  AIMS (if indicated):     Assets:  Communication Skills Desire for Improvement Financial Resources/Insurance Housing Physical Health Social Support Transportation  ADL's:  Intact  Cognition:  WNL  Sleep:  Number of Hours: 6.5   Problems Addressed: MDD severe  Treatment Plan Summary: Daily contact with patient to assess and evaluate symptoms and progress in treatment, Medication management and Plan is to:  -Start Wellbutrin XL 150 mg PO Daily for mood stability -Continue Geodon 80 mg PO BID for mood stability -Continue Neurontin 200 mg PO BID for agitation -Continue Trazodone 50 mg PO QHS PRN for insomnia -Continue Vistaril 25 mg TID PRN for anxiety -Continue Ativan 0.5 mg PO TID PRN for anxiety -Encourage group therapy participation  Maurice Bunnell, [name] 07/31/2017, 4:03 PM   Agree with NP Progress Note

## 2017-07-31 NOTE — [facility] Group Notes (Signed)
LCSW Group Therapy Note 07/31/2017 3:08 PM  Type of Therapy/Topic: Group Therapy: Emotion Regulation  Participation Level: Did Not Attend   Description of Group:  The purpose of this group is to assist patients in learning to regulate negative emotions and experience positive emotions. Patients will be guided to discuss ways in which they have been vulnerable to their negative emotions. These vulnerabilities will be juxtaposed with experiences of positive emotions or situations, and patients will be challenged to use positive emotions to combat negative ones. Special emphasis will be placed on coping with negative emotions in conflict situations, and patients will process healthy conflict resolution skills.  Therapeutic Goals: 1. Patient will identify two positive emotions or experiences to reflect on in order to balance out negative emotions 2. Patient will label two or more emotions that they find the most difficult to experience 3. Patient will demonstrate positive conflict resolution skills through discussion and/or role plays  Summary of Patient Progress:  Invited, chose not to attend.    Therapeutic Modalities:  Cognitive Behavioral Therapy Feelings Identification Dialectical Behavioral Therapy   Alcario DroughtJolan Tameko Halder LCSWA Clinical Social Worker

## 2017-07-31 NOTE — Progress Notes (Signed)
D: Pt denies SI/HI/AVH. Pt is pleasant and cooperative. Pt stated he was still concerned about going home to live with his mother due to them arguing. Pt was seen spending a lot of time at the nurses station. Pt needs redirection, but pt has been appropriate this evening.   A: Pt was offered support and encouragement. Pt was given scheduled medications. Pt was encourage to attend groups. Q 15 minute checks were done for safety.   R:Pt attends groups and interacts well with peers and staff. Pt is taking medication. Pt has no complaints.Pt receptive to treatment and safety maintained on unit.

## 2017-07-31 NOTE — Progress Notes (Signed)
D: Pt denies SI/HI/AVH. Pt is pleasant and cooperative. Pt seen on the unit this evening needing minimal redirection. Pt stated he was feeling better. Pt appeared happier this evening on the unit.   A: Pt was offered support and encouragement. Pt was given scheduled medications. Pt was encourage to attend groups. Q 15 minute checks were done for safety.   R:Pt attends groups and interacts well with peers and staff. Pt is taking medication. Pt has no complaints.Pt receptive to treatment and safety maintained on unit.

## 2017-07-31 NOTE — Plan of Care (Signed)
  Problem: Education: Goal: Mental status will improve Outcome: Progressing   Problem: Activity: Goal: Interest or engagement in activities will improve Outcome: Progressing   Problem: Coping: Goal: Ability to demonstrate self-control will improve Outcome: Progressing   Problem: Coping: Goal: Coping ability will improve Outcome: Progressing   Problem: Medication: Goal: Compliance with prescribed medication regimen will improve Outcome: Progressing

## 2017-07-31 NOTE — Progress Notes (Signed)
Recreation Therapy Notes   Date: 4.5.19 Time: 9:30 a.m. Location: [address] Dayroom       Group Topic/Focus: Stress Management   Goal Area(s) Addresses:  Patient will engage in pro-social way in yoga group with.  Patient will demonstrate no behavioral issues during group.   Intervention: Yoga   Clinical Observations/Feedback: Patient did not attend   Jadee Golebiewski, Recreation Therapy Intern  Revel Stellmach 07/31/2017 11:55 AM 

## 2017-07-31 NOTE — Progress Notes (Signed)
DAR NOTE: Patient presents with anxious affect with mood lability mood.  Reports suicidal thoughts on self inventory form but verbally contracts for safety.  Denies pain, auditory and visual hallucinations.  Described energy level as normal and concentration as good.  Rates depression at 5, hopelessness at 0, and anxiety at 0.  Maintained on routine safety checks.  Medications given as prescribed.  Support and encouragement offered as needed. Patient visible in milieu interacting with peers.  Patient was at the nursing area when he reached out and grabbed a paper clip from the desk.  Patient was asked to return the clip and was redirected away from the nursing area.  Patient continues to need redirection on the unit.  Patient is safe on the unit.

## 2017-07-31 NOTE — Plan of Care (Signed)
  Problem: Education: Goal: Emotional status will improve Outcome: Progressing   Problem: Education: Goal: Mental status will improve Outcome: Progressing   Problem: Activity: Goal: Interest or engagement in activities will improve Outcome: Progressing   Problem: Safety: Goal: Periods of time without injury will increase Outcome: Progressing   Problem: Coping: Goal: Coping ability will improve Outcome: Progressing   Problem: Self-Concept: Goal: Ability to disclose and discuss suicidal ideas will improve Outcome: Progressing

## 2017-08-01 NOTE — Progress Notes (Signed)
Adult Psychoeducational Group Note  Date:  08/01/2017 Time:  8:57 PM  Group Topic/Focus:  Wrap-Up Group:   The focus of this group is to help patients review their daily goal of treatment and discuss progress on daily workbooks.  Participation Level:  Active  Participation Quality:  Appropriate  Affect:  Appropriate  Cognitive:  Appropriate  Insight: Appropriate  Engagement in Group:  Engaged  Modes of Intervention:  Discussion  Additional Comments:  The patient expressed that he enjoys his peers. Octavio Mannshigpen, Eleshia Wooley Lee 08/01/2017, 8:57 PM

## 2017-08-01 NOTE — Progress Notes (Signed)
D. Pt presents with an anxious affect and labile mood, but has been pleasant and cooperative. Pt has been observed interacting with peers in dayroom appropriately Per pt's self inventory, pt rates his depression, hopelessness and anxiety a 10/0/5, respectively.  Pt currently denies SI/HI and AV hallucinations.  A. Labs and vitals monitored. Pt compliant with medications. Pt supported emotionally and encouraged to express concerns and ask questions.   R. Pt remains safe with 15 minute checks. Will continue POC.

## 2017-08-01 NOTE — Progress Notes (Signed)
D.  Pt pleasant on approach, no complaints voiced at this time.  Pt was positive for evening wrap up group.  Pt is intrusive at times and childlike.  Pt observed engaging with peers in dayroom.  Pt denies SI/HI/AVH at this time.  A. Support and encouragement offered, medication given as ordered  R.  Pt remains safe on the unit, will continue to monitor.

## 2017-08-01 NOTE — Progress Notes (Signed)
Pt attend wrap up group. His day was a 5. His goal was to rest. He said he was sleepy.   He talk rudly while other pt were talking during group he was distracted.

## 2017-08-01 NOTE — Progress Notes (Addendum)
[facility] MD Progress Note  08/01/2017 3:58 PM Maurice [name]  MRN:  [id number]    Subjective:  Reports partial improvement compared to admission States he feels depressed, but denies suicidal ideations and acknowledges he feels his mood is more stable .  He presents future oriented, and reports that he is not wanting to return to live with mother after discharge but would prefer to go to a group home environment . Concerned about facial skin/acne - states he was using  Benzaclin gel without side effects.  Denies medication side effects at this time.    Objective: I have reviewed chart notes and have met with patient. Presents improved compared to admission- seems  less labile,  pleasant on approach.  Has been visible in day room and interacting with peers appropriately, not as intrusive or labile , redirectable. Presents vaguely apprehensive about scheduled meeting with his mother , scheduled for Monday-  he is wanting to go to a group home rather than back to mother's but states he does feel meeting is important so they can decide and hopefully agree on disposition.  Intuniv was discontinued on admission (   reports he had not been taking x 2 days prior ) - he feels this medication caused him to " get angry" more easily.  States he has been on this medication on and off  for ADHD, but he feels better during periods he comes off it  .  Overall, has tolerated  Intuniv discontinuation well without significant WDL symptoms at this time-does not present  in any acute distress . BP today 148/75 .  At patient's request I have spoken with his mother via phone. She corroborates that patient presents with improvement but  expresses concern improvement may be temporary based on history of of waxing and waning severity of symptoms. Reviewed medications/medication changes with mother. Mother states patient had recently   been started on Fluvoxamine for anxiety, obsessions , and states he has also been on Wellbutrin XL  in the past , without  side effects. At this time mother wants patient to return home after discharge but patient is stating he wants to go to a [place] instead.       Principal Problem: MDD (major depressive disorder), recurrent severe, without psychosis ([place]) Diagnosis:   Patient Active Problem List   Diagnosis Date Noted  . MDD (major depressive disorder), recurrent severe, without psychosis ([place]) [F33.2] 07/28/2017  . Aggressive behavior [R46.89] 11/30/2015  . Hypertension [I10]   . Intentional overdose of drug in tablet form ([place]) [T50.902A]   . Other secondary hypertension [I15.8]   . Somnolence [R40.0]   . Overdose [T50.901A] 11/09/2015  . GERD (gastroesophageal reflux disease) [K21.9] 07/20/2015  . Appetite lost [R63.0] 07/20/2015  . Suicidal ideation [R45.851]   . DMDD (disruptive mood dysregulation disorder) ([place]) [F34.81] 03/13/2015  . Homicidal ideations [R45.850] 03/13/2015  . Intellectual disability [F79] 02/26/2015  . Attention-deficit hyperactivity disorder, predominantly hyperactive type [F90.1]   . ADHD (attention deficit hyperactivity disorder), combined type [F90.2] 01/31/2015  . Oppositional defiant disorder [F91.3] 01/31/2015  . Mood disorder ([place]) [F39] 01/31/2015   Total Time spent with patient: 20 minutes  Past Psychiatric History: See H&P  Past Medical History:  Past Medical History:  Diagnosis Date  . ADHD (attention deficit hyperactivity disorder)   . Allergy   . Appetite lost 07/20/2015  . Asthma   . GERD (gastroesophageal reflux disease) 07/20/2015  . Homicidal ideations   . Intellectual disability 02/26/2015  .  ODD (oppositional defiant disorder)   . Suicidal ideations    History reviewed. No pertinent surgical history. Family History:  Family History  Adopted: Yes   Family Psychiatric  History: See H&P Social History:  Social History   Substance and Sexual Activity  Alcohol Use Yes   Comment: I mix henessy with gatorade      Social History   Substance and Sexual Activity  Drug Use No    Social History   Socioeconomic History  . Marital status: Single    Spouse name: Not on file  . Number of children: Not on file  . Years of education: Not on file  . Highest education level: Not on file  Occupational History  . Not on file  Social Needs  . Financial resource strain: Not on file  . Food insecurity:    Worry: Not on file    Inability: Not on file  . Transportation needs:    Medical: Not on file    Non-medical: Not on file  Tobacco Use  . Smoking status: Never Smoker  . Smokeless tobacco: Never Used  Substance and Sexual Activity  . Alcohol use: Yes    Comment: I mix henessy with gatorade  . Drug use: No  . Sexual activity: Yes    Birth control/protection: Condom  Lifestyle  . Physical activity:    Days per week: Not on file    Minutes per session: Not on file  . Stress: Not on file  Relationships  . Social connections:    Talks on phone: Not on file    Gets together: Not on file    Attends religious service: Not on file    Active member of club or organization: Not on file    Attends meetings of clubs or organizations: Not on file    Relationship status: Not on file  Other Topics Concern  . Not on file  Social History Narrative  . Not on file   Additional Social History:   Sleep: Fair  Appetite:  Good  Current Medications: Current Facility-Administered Medications  Medication Dose Route Frequency Provider Last Rate Last Dose  . acetaminophen (TYLENOL) tablet 650 mg  650 mg Oral Q6H PRN Ethelene Hal, NP   650 mg at 08/01/17 2585  . albuterol (PROVENTIL HFA;VENTOLIN HFA) 108 (90 Base) MCG/ACT inhaler 2 puff  2 puff Inhalation Q6H PRN Ethelene Hal, NP      . alum & mag hydroxide-simeth (MAALOX/MYLANTA) 200-200-20 MG/5ML suspension 30 mL  30 mL Oral Q4H PRN Ethelene Hal, NP      . budesonide (PULMICORT) nebulizer solution 0.5 mg  0.5 mg Nebulization BID  Ethelene Hal, NP      . buPROPion (WELLBUTRIN XL) 24 hr tablet 150 mg  150 mg Oral Daily Money, Lowry Ram, [name]   150 mg at 08/01/17 0802  . fluticasone (FLONASE) 50 MCG/ACT nasal spray 1 spray  1 spray Each Nare Daily PRN Ethelene Hal, NP      . gabapentin (NEURONTIN) capsule 200 mg  200 mg Oral BID Cobos, Myer Peer, MD   200 mg at 08/01/17 0802  . hydrOXYzine (ATARAX/VISTARIL) tablet 25 mg  25 mg Oral TID PRN Ethelene Hal, NP   25 mg at 07/31/17 2106  . loratadine (CLARITIN) tablet 10 mg  10 mg Oral Daily Ethelene Hal, NP   10 mg at 08/01/17 0802  . LORazepam (ATIVAN) tablet 0.5 mg  0.5 mg Oral Q6H PRN Cobos,  Myer Peer, MD   0.5 mg at 07/31/17 2106  . magnesium hydroxide (MILK OF MAGNESIA) suspension 30 mL  30 mL Oral Daily PRN Ethelene Hal, NP      . montelukast (SINGULAIR) chewable tablet 5 mg  5 mg Oral [address] Lowry Ram, [name]   5 mg at 08/01/17 0630  . ondansetron (ZOFRAN) tablet 4 mg  4 mg Oral Q8H PRN Ethelene Hal, NP      . pantoprazole (PROTONIX) EC tablet 40 mg  40 mg Oral Daily Ethelene Hal, NP   40 mg at 08/01/17 0802  . traZODone (DESYREL) tablet 50 mg  50 mg Oral QHS PRN Cobos, Myer Peer, MD   50 mg at 07/31/17 2106  . ziprasidone (GEODON) capsule 80 mg  80 mg Oral BID WC Ethelene Hal, NP   80 mg at 08/01/17 8502    Lab Results: No results found for this or any previous visit (from the past 48 hour(s)).  Blood Alcohol level:  Lab Results  Component Value Date   ETH <10 07/27/2017   ETH <5 77/41/2878    Metabolic Disorder Labs: Lab Results  Component Value Date   HGBA1C 5.8 (H) 07/29/2017   MPG 119.76 07/29/2017   No results found for: PROLACTIN Lab Results  Component Value Date   CHOL 156 07/29/2017   TRIG 50 07/29/2017   HDL 62 07/29/2017   CHOLHDL 2.5 07/29/2017   VLDL 10 07/29/2017   LDLCALC 84 07/29/2017    Physical Findings: AIMS: Facial and Oral Movements Muscles of Facial  Expression: None, normal Lips and Perioral Area: None, normal Jaw: None, normal Tongue: None, normal,Extremity Movements Upper (arms, wrists, hands, fingers): None, normal Lower (legs, knees, ankles, toes): None, normal, Trunk Movements Neck, shoulders, hips: None, normal, Overall Severity Severity of abnormal movements (highest score from questions above): None, normal Incapacitation due to abnormal movements: None, normal Patient's awareness of abnormal movements (rate only patient's report): No Awareness, Dental Status Current problems with teeth and/or dentures?: No Does patient usually wear dentures?: No  CIWA:    COWS:     Musculoskeletal: Strength & Muscle Tone: within normal limits Gait & Station: normal Patient leans: N/A  Psychiatric Specialty Exam: Physical Exam  Nursing note and vitals reviewed. Constitutional: He is oriented to person, place, and time. He appears well-developed and well-nourished.  Cardiovascular: Normal rate.  Respiratory: Effort normal.  Musculoskeletal: Normal range of motion.  Neurological: He is alert and oriented to person, place, and time.  Skin: Skin is warm.    Review of Systems  Constitutional: Negative.   HENT: Negative.   Eyes: Negative.   Respiratory: Negative.   Cardiovascular: Negative.   Gastrointestinal: Negative.   Genitourinary: Negative.   Musculoskeletal: Negative.   Skin: Negative.   Neurological: Negative.   Endo/Heme/Allergies: Negative.   Psychiatric/Behavioral: Negative.   reports facial acne, denies headache,  denies chest pain, no shortness of breath,  reports intermittently sweaty palms.   Blood pressure (!) 148/75, pulse 94, temperature 99.3 F (37.4 C), temperature source Oral, resp. rate 18, height 5' 7"  (1.702 m), weight 67.1 kg (148 lb).  Repeat BP 140/80  General Appearance: improving grooming   Eye Contact:  Good  Speech:  Normal Rate  Volume:  Normal  Mood:  reports he continues to feel depressed,  but acknowledges he feels better   Affect:  less labile, improving , vaguely anxious   Thought Process:  Linear and Descriptions of Associations: Intact  Orientation:  Full (Time, Place, and Person)  Thought Content:  denies hallucinations, no delusions, not internally preoccupied   Suicidal Thoughts:  No denies suicidal or self injurious ideations, denies homicidal or violent ideations, contracts for safety on unit   Homicidal Thoughts:  No  Memory:  recent and remote grossly intact   Judgement:  Other:  improving   Insight:  Fair improving   Psychomotor Activity:  Normal- no psychomotor agitation   Concentration:  Concentration: Good and Attention Span: Good  Recall:  Good  Fund of Knowledge:  Good  Language:  Good  Akathisia:  No  Handed:  Right  AIMS (if indicated):     Assets:  Communication Skills Desire for Improvement Financial Resources/Insurance Housing Physical Health Social Support Transportation  ADL's:  Intact  Cognition:  WNL  Sleep:  Number of Hours: 5.25   Assessment - patient presents with less affective lability, and does not present irritable , no explosiveness on unit, presents less impulsive . Intuniv , which he had been prescribed for ADHD prior to admission, may have been contributing  to increased irritability.   Currently on Wellbutrin XL for management of ADHD and depression.    Treatment Plan Summary: Daily contact with patient to assess and evaluate symptoms and progress in treatment, Medication management and Plan is to:  -Treatment Plan reviewed as below today 4/6  -Continue  Wellbutrin XL 150 mg QDAY for depression and ADHD symptoms -Continue Geodon 80 mg PO BID for mood disorder, impulsivity  -Continue Neurontin 200 mg PO BID for anxiety -Continue Trazodone 50 mg PO QHS PRN for insomnia -Continue Vistaril 25 mg TID PRN for anxiety -Continue Ativan 0.5 mg PO TID PRN for anxiety -Benzaclin topical not on formulary, will review with pharmacist   -Encourage group therapy participation to work on coping skills and symptom reduction. -Treatment team working on disposition planning options -Family meeting scheduled for Monday    Jenne Campus, MD 08/01/2017, 3:58 PM   Patient ID: Calton Golds, male   DOB: 03-07-1996, 22 y.o.   MRN: [id number]

## 2017-08-01 NOTE — [facility] Group Notes (Signed)
[facility] Group Notes:  (Nursing)  Date:  08/01/2017  Time:  4:00 PM Type of Therapy:  Nurse Education  Participation Level:  None  Participation Quality:  came in at the very end of group  Affect:  Labile  Cognitive:  came at the very end of group  Insight:  None  Engagement in Group:  None  Modes of Intervention:  Discussion, Education and Problem-solving  Summary of Progress/Problems: Patient came at the very end of group Shela NevinValerie S Ragen Laver 08/01/2017, 5:23 PM

## 2017-08-02 MED ORDER — AMLODIPINE BESYLATE 5 MG PO TABS
5.0000 mg | ORAL_TABLET | Freq: Every day | ORAL | Status: DC
  Administered 2017-08-02 – 2017-08-03 (×2): 5 mg via ORAL
  Filled 2017-08-02 (×5): qty 1

## 2017-08-02 MED ORDER — MENTHOL 3 MG MT LOZG: 1.0000 | LOZENGE | OROMUCOSAL | Status: DC | PRN

## 2017-08-02 MED ORDER — GABAPENTIN 300 MG PO CAPS
300.0000 mg | ORAL_CAPSULE | Freq: Two times a day (BID) | ORAL | Status: DC
  Administered 2017-08-02 – 2017-08-04 (×4): 300 mg via ORAL
  Filled 2017-08-02 (×9): qty 1

## 2017-08-02 NOTE — Progress Notes (Addendum)
[facility] MD Progress Note  08/02/2017 11:51 AM Maurice [name]  MRN:  [id number]    Subjective: Patient reports he has been doing "all right".  He does endorse some increased anxiety regarding upcoming family meeting, but is in agreement with meeting. Identifies major issues as thinking he would be better if he went to a group home rather than back home, and feeling that his mother wants him to be  on "too many medications".  States his hope would be to "come off all of them if I could".  Of note he acknowledges that mother has his best interest at heart.   Objective: I have reviewed chart notes  and have met with patient. Patient presents stable, pleasant on approach, states he has spent his time going to groups, drawing, writing song lyrics.  Noted to be in group room interacting appropriately with peers.  He has had no explosive or angry outbursts and has presented calm and pleasant on approach. Does not present in any acute distress, no psychomotor agitation, no tremors.  As above, somewhat apprehensive about family meeting, but hoping that it will result in a disposition plan that he and his mother agree on.  Of note, mother has legal guardianship. Patient reports he would like to minimize the number of medications he is on.  He does feel that Geodon and Neurontin are helpful, and denies side effects.  He is less sure about Wellbutrin XL, which had recently been started for depression and ADHD management, but does not endorse side effects. He has reiterated that he feels Intuniv caused him to be more easily irritated and angry and feeling better off it. Patient / mother have  reported he had recently started Fluvoxamine, which he states he had taken  for only a brief period of time prior to admission.Patient reports  it was started for "sexual thoughts", but  states "I am a man, I think of sex, I think that is normal" : currently denies any obsessions or persistent /intrusive ruminations of sexual  content. Denies suicidal ideations.  Currently denies any violent or homicidal ideations.        Principal Problem: MDD (major depressive disorder), recurrent severe, without psychosis ([place]) Diagnosis:   Patient Active Problem List   Diagnosis Date Noted  . MDD (major depressive disorder), recurrent severe, without psychosis ([place]) [F33.2] 07/28/2017  . Aggressive behavior [R46.89] 11/30/2015  . Hypertension [I10]   . Intentional overdose of drug in tablet form ([place]) [T50.902A]   . Other secondary hypertension [I15.8]   . Somnolence [R40.0]   . Overdose [T50.901A] 11/09/2015  . GERD (gastroesophageal reflux disease) [K21.9] 07/20/2015  . Appetite lost [R63.0] 07/20/2015  . Suicidal ideation [R45.851]   . DMDD (disruptive mood dysregulation disorder) ([place]) [F34.81] 03/13/2015  . Homicidal ideations [R45.850] 03/13/2015  . Intellectual disability [F79] 02/26/2015  . Attention-deficit hyperactivity disorder, predominantly hyperactive type [F90.1]   . ADHD (attention deficit hyperactivity disorder), combined type [F90.2] 01/31/2015  . Oppositional defiant disorder [F91.3] 01/31/2015  . Mood disorder ([place]) [F39] 01/31/2015   Total Time spent with patient: 20 minutes  Past Psychiatric History: See H&P  Past Medical History:  Past Medical History:  Diagnosis Date  . ADHD (attention deficit hyperactivity disorder)   . Allergy   . Appetite lost 07/20/2015  . Asthma   . GERD (gastroesophageal reflux disease) 07/20/2015  . Homicidal ideations   . Intellectual disability 02/26/2015  . ODD (oppositional defiant disorder)   . Suicidal ideations    History reviewed.  No pertinent surgical history. Family History:  Family History  Adopted: Yes   Family Psychiatric  History: See H&P Social History:  Social History   Substance and Sexual Activity  Alcohol Use Yes   Comment: I mix henessy with gatorade     Social History   Substance and Sexual Activity  Drug Use No    Social  History   Socioeconomic History  . Marital status: Single    Spouse name: Not on file  . Number of children: Not on file  . Years of education: Not on file  . Highest education level: Not on file  Occupational History  . Not on file  Social Needs  . Financial resource strain: Not on file  . Food insecurity:    Worry: Not on file    Inability: Not on file  . Transportation needs:    Medical: Not on file    Non-medical: Not on file  Tobacco Use  . Smoking status: Never Smoker  . Smokeless tobacco: Never Used  Substance and Sexual Activity  . Alcohol use: Yes    Comment: I mix henessy with gatorade  . Drug use: No  . Sexual activity: Yes    Birth control/protection: Condom  Lifestyle  . Physical activity:    Days per week: Not on file    Minutes per session: Not on file  . Stress: Not on file  Relationships  . Social connections:    Talks on phone: Not on file    Gets together: Not on file    Attends religious service: Not on file    Active member of club or organization: Not on file    Attends meetings of clubs or organizations: Not on file    Relationship status: Not on file  Other Topics Concern  . Not on file  Social History Narrative  . Not on file   Additional Social History:   Sleep: Good  Appetite:  Good  Current Medications: Current Facility-Administered Medications  Medication Dose Route Frequency Provider Last Rate Last Dose  . acetaminophen (TYLENOL) tablet 650 mg  650 mg Oral Q6H PRN Maurice Hal, NP   650 mg at 08/01/17 4627  . albuterol (PROVENTIL HFA;VENTOLIN HFA) 108 (90 Base) MCG/ACT inhaler 2 puff  2 puff Inhalation Q6H PRN Maurice Hal, NP      . alum & mag hydroxide-simeth (MAALOX/MYLANTA) 200-200-20 MG/5ML suspension 30 mL  30 mL Oral Q4H PRN Maurice Hal, NP      . budesonide (PULMICORT) nebulizer solution 0.5 mg  0.5 mg Nebulization BID Maurice Hal, NP      . fluticasone [facility]) 50 MCG/ACT nasal  spray 1 spray  1 spray Each Nare Daily PRN Maurice Hal, NP      . gabapentin (NEURONTIN) capsule 200 mg  200 mg Oral BID Maurice [name], Maurice Peer, MD   200 mg at 08/02/17 0802  . hydrOXYzine (ATARAX/VISTARIL) tablet 25 mg  25 mg Oral TID PRN Maurice Hal, NP   25 mg at 08/01/17 2127  . loratadine (CLARITIN) tablet 10 mg  10 mg Oral Daily Maurice Hal, NP   10 mg at 08/02/17 0803  . LORazepam (ATIVAN) tablet 0.5 mg  0.5 mg Oral Q6H PRN Aryiah Monterosso, Maurice Peer, MD   0.5 mg at 08/01/17 2127  . magnesium hydroxide (MILK OF MAGNESIA) suspension 30 mL  30 mL Oral Daily PRN Maurice Hal, NP      . menthol-cetylpyridinium (CEPACOL) lozenge 3  mg  1 lozenge Oral PRN Lindon Romp A, NP      . montelukast (SINGULAIR) chewable tablet 5 mg  5 mg Oral [address] Lowry Ram, [name]   5 mg at 08/02/17 0549  . ondansetron (ZOFRAN) tablet 4 mg  4 mg Oral Q8H PRN Maurice Hal, NP      . pantoprazole (PROTONIX) EC tablet 40 mg  40 mg Oral Daily Maurice Hal, NP   40 mg at 08/02/17 0802  . traZODone (DESYREL) tablet 50 mg  50 mg Oral QHS PRN Mylo Choi, Maurice Peer, MD   50 mg at 08/01/17 2127  . ziprasidone (GEODON) capsule 80 mg  80 mg Oral BID WC Maurice Hal, NP   80 mg at 08/02/17 5852    Lab Results: No results found for this or any previous visit (from the past 54 hour(s)).  Blood Alcohol level:  Lab Results  Component Value Date   ETH <10 07/27/2017   ETH <5 77/82/4235    Metabolic Disorder Labs: Lab Results  Component Value Date   HGBA1C 5.8 (H) 07/29/2017   MPG 119.76 07/29/2017   No results found for: PROLACTIN Lab Results  Component Value Date   CHOL 156 07/29/2017   TRIG 50 07/29/2017   HDL 62 07/29/2017   CHOLHDL 2.5 07/29/2017   VLDL 10 07/29/2017   LDLCALC 84 07/29/2017    Physical Findings: AIMS: Facial and Oral Movements Muscles of Facial Expression: None, normal Lips and Perioral Area: None, normal Jaw: None, normal Tongue: None,  normal,Extremity Movements Upper (arms, wrists, hands, fingers): None, normal Lower (legs, knees, ankles, toes): None, normal, Trunk Movements Neck, shoulders, hips: None, normal, Overall Severity Severity of abnormal movements (highest score from questions above): None, normal Incapacitation due to abnormal movements: None, normal Patient's awareness of abnormal movements (rate only patient's report): No Awareness, Dental Status Current problems with teeth and/or dentures?: No Does patient usually wear dentures?: No  CIWA:    COWS:     Musculoskeletal: Strength & Muscle Tone: within normal limits Gait & Station: normal Patient leans: N/A  Psychiatric Specialty Exam: Physical Exam  Nursing note and vitals reviewed. Constitutional: He is oriented to person, place, and time. He appears well-developed and well-nourished.  Cardiovascular: Normal rate.  Respiratory: Effort normal.  Musculoskeletal: Normal range of motion.  Neurological: He is alert and oriented to person, place, and time.  Skin: Skin is warm.    Review of Systems  Constitutional: Negative.   HENT: Negative.   Eyes: Negative.   Respiratory: Negative.   Cardiovascular: Negative.   Gastrointestinal: Negative.   Genitourinary: Negative.   Musculoskeletal: Negative.   Skin: Negative.   Neurological: Negative.   Endo/Heme/Allergies: Negative.   Psychiatric/Behavioral: Negative.   reports facial acne, denies headache,  denies chest pain, no shortness of breath  Blood pressure (!) 149/72, pulse 97, temperature 99.1 F (37.3 C), temperature source Oral, resp. rate 20, height 5' 7"  (1.702 m), weight 67.1 kg (148 lb).    General Appearance: Well Groomed  Eye Contact:  Good  Speech:  Normal Rate  Volume:  Normal  Mood:  Reports some anxiety, mood has improved  Affect:  Anxious and vaguely irritable today, but affect brightens during interactions with peers and staff  Thought Process:  Linear and Descriptions of  Associations: Intact  Orientation:  Full (Time, Place, and Person)  Thought Content:  denies hallucinations, no delusions, not internally preoccupied   Suicidal Thoughts:  No denies suicidal or self injurious  ideations, denies homicidal or violent ideations, contracts for safety on unit   Homicidal Thoughts:  No  Memory:  recent and remote grossly intact   Judgement:  Other:  improving   Insight:  Fair improving   Psychomotor Activity:  Normal- no psychomotor agitation   Concentration:  Concentration: Good and Attention Span: Good  Recall:  Good  Fund of Knowledge:  Good  Language:  Good  Akathisia:  No  Handed:  Right  AIMS (if indicated):     Assets:  Communication Skills Desire for Improvement Financial Resources/Insurance Housing Physical Health Social Support Transportation  ADL's:  Intact  Cognition:  WNL  Sleep:  Number of Hours: 6.75   Assessment -currently patient presents with improvement.  He  does report some increased anxiety pertaining to a scheduled family meeting with his mother tomorrow (mother is legal guardian).  In particular worries about what disposition plan will be, and also about trying to minimize the number of medications he is on.  Mother and patient have reported that he was recently started on fluvoxamine, but patient questions indication ( although he does endorse depression).  He has had no overt explosiveness or angry outbursts on unit and has generally presented calm and pleasant, which she attributes in part to being off Intuniv.      Treatment Plan Summary: Daily contact with patient to assess and evaluate symptoms and progress in treatment, Medication management and Plan is to:  -Treatment Plan reviewed as below today 4/6  -Continue Geodon 80 mg PO BID for mood disorder, impulsivity  -Increase Neurontin to 300 mg PO BID for anxiety -Continue Trazodone 50 mg PO QHS PRN for insomnia -Continue Vistaril 25 mg TID PRN for anxiety -Continue Ativan  0.5 mg PO TID PRN for anxiety -Benzaclin topical not on formulary, will review with pharmacist. -Encourage group therapy participation to work on coping skills and symptom reduction. -Treatment team working on disposition planning options -Family meeting scheduled for Monday    Jenne Campus, MD 08/02/2017, 11:51 AM   Patient ID: Maurice [name], Maurice [name]   DOB: Oct 03, 1995, 79 y.o.   MRN: [id number]

## 2017-08-02 NOTE — Progress Notes (Addendum)
Patient's BP has been trending upward and after discussing with Dr. Jama Flavorsobos decided to start Norvasc 5 mg Daily. Contacted patient's mom, Toniann FailWendy, for authorization to start, since she is the patient's legal guardian.   Agree with NP Progress Note

## 2017-08-02 NOTE — Progress Notes (Signed)
D Pt presents to the window at [facility] this am. He is loud, agitated and he says" man... I don't see why yall pushin medicines at [place] all the time...you woke me UP this mornin to take medicines and now youre tellin me to take more medicines....man.. This is getting on my nerves..."   A Pt completed his daily assessment as planned an on this he wrote  He denied having SI today and eh rated his depression, hopelessness and anxeity " 3/3", respectively. Pt spoke with physician ( in front of this Probation officer), telling him pt takes luvox for obsessive thoughts and that he wants to continue to take it and that " it helps a lot". Pt goes on to tell physician that he cont to struggle with the balance of the right medicines, that he struggles with modulating his behaviors and that he " don't wanna take more medicines than I have to...ibuprofen don't wanna turn into a druggy".....> pt able to process with writer that he can learn healthier coping skills to learn to get his needs met.   R Safety is in place.

## 2017-08-02 NOTE — [facility] Group Notes (Signed)
[facility] LCSW Group Therapy Note  Date/Time: 08/02/17, 1000  Type of Therapy/Topic:  Group Therapy:  Feelings about Diagnosis  Participation Level:  Minimal   Mood:distracted   Description of Group:    This group will allow patients to explore their thoughts and feelings about diagnoses they have received. Patients will be guided to explore their level of understanding and acceptance of these diagnoses. Facilitator will encourage patients to process their thoughts and feelings about the reactions of others to their diagnosis, and will guide patients in identifying ways to discuss their diagnosis with significant others in their lives. This group will be process-oriented, with patients participating in exploration of their own experiences as well as giving and receiving support and challenge from other group members.   Therapeutic Goals: 1. Patient will demonstrate understanding of diagnosis as evidence by identifying two or more symptoms of the disorder:  2. Patient will be able to express two feelings regarding the diagnosis 3. Patient will demonstrate ability to communicate their needs through discussion and/or role plays  Summary of Patient Progress:Pt left group towards the beginning and returned towards the end.  Pt made a few comments but was not present or much engaged.        Therapeutic Modalities:   Cognitive Behavioral Therapy Brief Therapy Feelings Identification   Daleen SquibbGreg Seaira Byus, LCSW

## 2017-08-03 MED ORDER — CLONIDINE HCL 0.1 MG PO TABS
0.0500 mg | ORAL_TABLET | Freq: Two times a day (BID) | ORAL | Status: DC
  Administered 2017-08-03 – 2017-08-04 (×2): 0.05 mg via ORAL
  Filled 2017-08-03 (×6): qty 0.5
  Filled 2017-08-03: qty 1

## 2017-08-03 MED ORDER — CLONIDINE HCL 0.1 MG PO TABS: 0.1000 mg | ORAL_TABLET | Freq: Two times a day (BID) | ORAL | Status: DC

## 2017-08-03 NOTE — Progress Notes (Signed)
Writer has observed patient up in the dayroom laughing and taking with other peers. He reports having had a good day.When writer asked if he had a goal, he reported that he wants to find another group home to stay at. Support given and safety maintained  with 15 min checks.

## 2017-08-03 NOTE — Progress Notes (Addendum)
[facility] MD Progress Note  08/03/2017 1:05 PM BRAELEN SPROULE  MRN:  [id number]    Subjective: patient anxious today regarding scheduled family meeting with mother, but overall stating he is feeling better. Of note, requested not to be present during session. Denies medication side effects.   Objective: I have reviewed case with treatment team , pharmacist, and have met with patient. Patient presents with increased anxiety today, but behavior in good control, not psycho-motorically agitated . As above, reported being anxious about scheduled family meeting with his mother, and requested not to be present during session but towards the latter end of session did briefly and appropriately interact with mother, gave her hug, asked for additional clothes. Writer met with mother along with CSW. Mother agrees  patient currently doing better and presenting calm, but expressing concern that at home patient tends to be more explosive, unpredictable, and escalates quickly during arguments. She describes that he often seems to " test" her with the purpose of confirming that she  supportive / loves him. She states she would prefer for patient to come home after discharge as she feels he would be safer there, but states his [facility] worker is also working on disposition to a higher level group setting to which she would agree if available . She states that he had recently ( 3 or so weeks prior to admission)  been started on Fluvoxamine for sexual behaviors,  describes patient had been masturbating frequently and she feels compulsively. She states medication was well tolerated and effective . Of note, there has been presentation of  inappropriate sexual behaviors or actions on unit thus far.  We reviewed medication issues with patient and with mother- as noted in prior notes, patient states he feels Geodon and Neurontin effective and well tolerated, but feels Intuniv caused him to be more irritable and feels better off  it. Wellbutrin XL was started, has been on this medication in the past , but it was stopped due to concern of increasing BP. Of note, patient has described family history of HTN.  Clonidine was considered an option to address history of impulsivity, explosiveness , and mother, patient agree to trial.         Principal Problem: MDD (major depressive disorder), recurrent severe, without psychosis ([place]) Diagnosis:   Patient Active Problem List   Diagnosis Date Noted  . MDD (major depressive disorder), recurrent severe, without psychosis ([place]) [F33.2] 07/28/2017  . Aggressive behavior [R46.89] 11/30/2015  . Hypertension [I10]   . Intentional overdose of drug in tablet form ([place]) [T50.902A]   . Other secondary hypertension [I15.8]   . Somnolence [R40.0]   . Overdose [T50.901A] 11/09/2015  . GERD (gastroesophageal reflux disease) [K21.9] 07/20/2015  . Appetite lost [R63.0] 07/20/2015  . Suicidal ideation [R45.851]   . DMDD (disruptive mood dysregulation disorder) ([place]) [F34.81] 03/13/2015  . Homicidal ideations [R45.850] 03/13/2015  . Intellectual disability [F79] 02/26/2015  . Attention-deficit hyperactivity disorder, predominantly hyperactive type [F90.1]   . ADHD (attention deficit hyperactivity disorder), combined type [F90.2] 01/31/2015  . Oppositional defiant disorder [F91.3] 01/31/2015  . Mood disorder ([place]) [F39] 01/31/2015   Total Time spent with patient: 35 minutes- more than half of time spent working on counseling and disposition planning   Past Psychiatric History: See H&P  Past Medical History:  Past Medical History:  Diagnosis Date  . ADHD (attention deficit hyperactivity disorder)   . Allergy   . Appetite lost 07/20/2015  . Asthma   . GERD (gastroesophageal reflux  disease) 07/20/2015  . Homicidal ideations   . Intellectual disability 02/26/2015  . ODD (oppositional defiant disorder)   . Suicidal ideations    History reviewed. No pertinent surgical  history. Family History:  Family History  Adopted: Yes   Family Psychiatric  History: See H&P Social History:  Social History   Substance and Sexual Activity  Alcohol Use Yes   Comment: I mix henessy with gatorade     Social History   Substance and Sexual Activity  Drug Use No    Social History   Socioeconomic History  . Marital status: Single    Spouse name: Not on file  . Number of children: Not on file  . Years of education: Not on file  . Highest education level: Not on file  Occupational History  . Not on file  Social Needs  . Financial resource strain: Not on file  . Food insecurity:    Worry: Not on file    Inability: Not on file  . Transportation needs:    Medical: Not on file    Non-medical: Not on file  Tobacco Use  . Smoking status: Never Smoker  . Smokeless tobacco: Never Used  Substance and Sexual Activity  . Alcohol use: Yes    Comment: I mix henessy with gatorade  . Drug use: No  . Sexual activity: Yes    Birth control/protection: Condom  Lifestyle  . Physical activity:    Days per week: Not on file    Minutes per session: Not on file  . Stress: Not on file  Relationships  . Social connections:    Talks on phone: Not on file    Gets together: Not on file    Attends religious service: Not on file    Active member of club or organization: Not on file    Attends meetings of clubs or organizations: Not on file    Relationship status: Not on file  Other Topics Concern  . Not on file  Social History Narrative  . Not on file   Additional Social History:   Sleep: Good  Appetite:  Good  Current Medications: Current Facility-Administered Medications  Medication Dose Route Frequency Provider Last Rate Last Dose  . acetaminophen (TYLENOL) tablet 650 mg  650 mg Oral Q6H PRN Ethelene Hal, NP   650 mg at 08/02/17 2112  . albuterol (PROVENTIL HFA;VENTOLIN HFA) 108 (90 Base) MCG/ACT inhaler 2 puff  2 puff Inhalation Q6H PRN Ethelene Hal, NP      . alum & mag hydroxide-simeth (MAALOX/MYLANTA) 200-200-20 MG/5ML suspension 30 mL  30 mL Oral Q4H PRN Ethelene Hal, NP      . amLODipine (NORVASC) tablet 5 mg  5 mg Oral Daily Money, Lowry Ram, [name]   5 mg at 08/03/17 0757  . budesonide (PULMICORT) nebulizer solution 0.5 mg  0.5 mg Nebulization BID Ethelene Hal, NP      . fluticasone [facility]) 50 MCG/ACT nasal spray 1 spray  1 spray Each Nare Daily PRN Ethelene Hal, NP      . gabapentin (NEURONTIN) capsule 300 mg  300 mg Oral BID Cobos, Myer Peer, MD   300 mg at 08/03/17 0757  . hydrOXYzine (ATARAX/VISTARIL) tablet 25 mg  25 mg Oral TID PRN Ethelene Hal, NP   25 mg at 08/02/17 2110  . loratadine (CLARITIN) tablet 10 mg  10 mg Oral Daily Ethelene Hal, NP   10 mg at 08/03/17 0757  . LORazepam (  ATIVAN) tablet 0.5 mg  0.5 mg Oral Q6H PRN Cobos, Myer Peer, MD   0.5 mg at 08/02/17 2109  . magnesium hydroxide (MILK OF MAGNESIA) suspension 30 mL  30 mL Oral Daily PRN Ethelene Hal, NP      . menthol-cetylpyridinium (CEPACOL) lozenge 3 mg  1 lozenge Oral PRN Lindon Romp A, NP      . montelukast (SINGULAIR) chewable tablet 5 mg  5 mg Oral [address], Lowry Ram, [name]   5 mg at 08/03/17 8295  . ondansetron (ZOFRAN) tablet 4 mg  4 mg Oral Q8H PRN Ethelene Hal, NP      . pantoprazole (PROTONIX) EC tablet 40 mg  40 mg Oral Daily Ethelene Hal, NP   40 mg at 08/03/17 0757  . traZODone (DESYREL) tablet 50 mg  50 mg Oral QHS PRN Cobos, Myer Peer, MD   50 mg at 08/02/17 2110  . ziprasidone (GEODON) capsule 80 mg  80 mg Oral BID WC Ethelene Hal, NP   80 mg at 08/03/17 6213    Lab Results: No results found for this or any previous visit (from the past 48 hour(s)).  Blood Alcohol level:  Lab Results  Component Value Date   ETH <10 07/27/2017   ETH <5 08/65/7846    Metabolic Disorder Labs: Lab Results  Component Value Date   HGBA1C 5.8 (H) 07/29/2017   MPG  119.76 07/29/2017   No results found for: PROLACTIN Lab Results  Component Value Date   CHOL 156 07/29/2017   TRIG 50 07/29/2017   HDL 62 07/29/2017   CHOLHDL 2.5 07/29/2017   VLDL 10 07/29/2017   LDLCALC 84 07/29/2017    Physical Findings: AIMS: Facial and Oral Movements Muscles of Facial Expression: None, normal Lips and Perioral Area: None, normal Jaw: None, normal Tongue: None, normal,Extremity Movements Upper (arms, wrists, hands, fingers): None, normal Lower (legs, knees, ankles, toes): None, normal, Trunk Movements Neck, shoulders, hips: None, normal, Overall Severity Severity of abnormal movements (highest score from questions above): None, normal Incapacitation due to abnormal movements: None, normal Patient's awareness of abnormal movements (rate only patient's report): No Awareness, Dental Status Current problems with teeth and/or dentures?: No Does patient usually wear dentures?: No  CIWA:    COWS:     Musculoskeletal: Strength & Muscle Tone: within normal limits Gait & Station: normal Patient leans: N/A  Psychiatric Specialty Exam: Physical Exam  Nursing note and vitals reviewed. Constitutional: He is oriented to person, place, and time. He appears well-developed and well-nourished.  Cardiovascular: Normal rate.  Respiratory: Effort normal.  Musculoskeletal: Normal range of motion.  Neurological: He is alert and oriented to person, place, and time.  Skin: Skin is warm.    Review of Systems  Constitutional: Negative.   HENT: Negative.   Eyes: Negative.   Respiratory: Negative.   Cardiovascular: Negative.   Gastrointestinal: Negative.   Genitourinary: Negative.   Musculoskeletal: Negative.   Skin: Negative.   Neurological: Negative.   Endo/Heme/Allergies: Negative.   Psychiatric/Behavioral: Negative.   reports facial acne, denies headache,  denies chest pain, no shortness of breath  Blood pressure (!) 143/95, pulse (!) 105, temperature 98.3 F  (36.8 C), temperature source Oral, resp. rate 20, height 5' 7"  (1.702 m), weight 67.1 kg (148 lb).  Repeat BP 152/85.  General Appearance: Well Groomed  Eye Contact:  Good  Speech:  Normal Rate  Volume:  Normal  Mood:  reports mood "OK", affect anxious today  Affect:  anxious, but improves during session  Thought Process:  Linear and Descriptions of Associations: Intact  Orientation:  Full (Time, Place, and Person)  Thought Content:  denies hallucinations, no delusions, not internally preoccupied   Suicidal Thoughts:  No denies suicidal or self injurious ideations, denies homicidal or violent ideations, contracts for safety on unit   Homicidal Thoughts:  No  Memory:  recent and remote grossly intact   Judgement:  Other:  improving   Insight:  Fair improving   Psychomotor Activity:  Normal- no psychomotor agitation   Concentration:  Concentration: Good and Attention Span: Good  Recall:  Good  Fund of Knowledge:  Good  Language:  Good  Akathisia:  No  Handed:  Right  AIMS (if indicated):     Assets:  Communication Skills Desire for Improvement Financial Resources/Insurance Housing Physical Health Social Support Transportation  ADL's:  Intact  Cognition:  WNL  Sleep:  Number of Hours: 5.75   Assessment - patient's behavior has been in good control, without explosive or angry outbursts. Today anxious in the context of family meeting with mother and requested not to be present. Mother corroborates patient is improved, but states she thinks his behavior may deteriorate at home. She states she would prefer for patient to go home, but that [facility] case worker is working on an appropriate level group home which she would agree with. Medication issues have been reviewed as above . Patient tolerating Neurontin, Geodon well and currently agreeing to Clonidine for management of ADHD, impulsivity, and to address BP. Patient wanting to simplify medication management and ambivalent about adding  Luvox.      Treatment Plan Summary: Daily contact with patient to assess and evaluate symptoms and progress in treatment, Medication management and Plan is to:  -Treatment Plan reviewed as below today 4/8 -Continue Geodon 80 mg PO BID for mood disorder, impulsivity  -Continue  Neurontin  300 mg PO BID for anxiety -Continue Trazodone 50 mg PO QHS PRN for insomnia -Start Clonidine at 0.05 mgrs BID for ADHD, impulsivity  -Continue Ativan 0.5 mg PO TID PRN for anxiety -Benzaclin topical for facial acne  not on formulary, will review with pharmacist. Have requested for mother to bring it , will be given to pharmacist to assess if he can use his home supply  -Encourage group therapy participation to work on coping skills and symptom reduction. -Treatment team working on disposition planning options- see above    Jenne Campus, MD 08/03/2017, 1:05 PM   Patient ID: Calton Golds, male   DOB: Aug 28, 1995, 22 y.o.   MRN: [id number]

## 2017-08-03 NOTE — Tx Team (Signed)
Interdisciplinary Treatment and Diagnostic Plan Update  08/03/2017 Time of Session: 10:45am Maurice [name] MRN: [id number]  Principal Diagnosis: MDD (major depressive disorder), recurrent severe, without psychosis (HCC)  Secondary Diagnoses: Principal Problem:   MDD (major depressive disorder), recurrent severe, without psychosis (HCC)   Current Medications:  Current Facility-Administered Medications  Medication Dose Route Frequency Provider Last Rate Last Dose  . acetaminophen (TYLENOL) tablet 650 mg  650 mg Oral Q6H PRN Laveda Abbe, NP   650 mg at 08/02/17 2112  . albuterol (PROVENTIL HFA;VENTOLIN HFA) 108 (90 Base) MCG/ACT inhaler 2 puff  2 puff Inhalation Q6H PRN Laveda Abbe, NP      . alum & mag hydroxide-simeth (MAALOX/MYLANTA) 200-200-20 MG/5ML suspension 30 mL  30 mL Oral Q4H PRN Laveda Abbe, NP      . amLODipine (NORVASC) tablet 5 mg  5 mg Oral Daily Money, Gerlene Burdock, [name]   5 mg at 08/03/17 0757  . budesonide (PULMICORT) nebulizer solution 0.5 mg  0.5 mg Nebulization BID Laveda Abbe, NP      . fluticasone [facility]) 50 MCG/ACT nasal spray 1 spray  1 spray Each Nare Daily PRN Laveda Abbe, NP      . gabapentin (NEURONTIN) capsule 300 mg  300 mg Oral BID Cobos, Rockey Situ, MD   300 mg at 08/03/17 0757  . hydrOXYzine (ATARAX/VISTARIL) tablet 25 mg  25 mg Oral TID PRN Laveda Abbe, NP   25 mg at 08/02/17 2110  . loratadine (CLARITIN) tablet 10 mg  10 mg Oral Daily Laveda Abbe, NP   10 mg at 08/03/17 0757  . LORazepam (ATIVAN) tablet 0.5 mg  0.5 mg Oral Q6H PRN Cobos, Rockey Situ, MD   0.5 mg at 08/02/17 2109  . magnesium hydroxide (MILK OF MAGNESIA) suspension 30 mL  30 mL Oral Daily PRN Laveda Abbe, NP      . menthol-cetylpyridinium (CEPACOL) lozenge 3 mg  1 lozenge Oral PRN Nira Conn A, NP      . montelukast (SINGULAIR) chewable tablet 5 mg  5 mg Oral [address], Gerlene Burdock, [name]   5 mg at 08/03/17 4098  .  ondansetron (ZOFRAN) tablet 4 mg  4 mg Oral Q8H PRN Laveda Abbe, NP      . pantoprazole (PROTONIX) EC tablet 40 mg  40 mg Oral Daily Laveda Abbe, NP   40 mg at 08/03/17 0757  . traZODone (DESYREL) tablet 50 mg  50 mg Oral QHS PRN Cobos, Rockey Situ, MD   50 mg at 08/02/17 2110  . ziprasidone (GEODON) capsule 80 mg  80 mg Oral BID WC Laveda Abbe, NP   80 mg at 08/03/17 0757   PTA Medications: Medications Prior to Admission  Medication Sig Dispense Refill Last Dose  . albuterol (PROAIR HFA) 108 (90 Base) MCG/ACT inhaler Inhale 2 puffs into the lungs every 6 (six) hours as needed for wheezing or shortness of breath. 1 Inhaler 0 Past Month at Unknown time  . cetirizine (ZYRTEC) 10 MG tablet Take 1 tablet (10 mg total) by mouth daily. 30 tablet 0 07/26/2017 at Unknown time  . clindamycin-benzoyl peroxide (BENZACLIN) gel Apply 1 application topically See admin instructions. Apply thin amount to face every other night (alternate with differin gel)   Past Week at Unknown time  . fluticasone (FLONASE) 50 MCG/ACT nasal spray Place 1 spray into both nostrils daily. (Patient taking differently: Place 1 spray into both nostrils daily as needed for allergies. )  16 g 0 07/26/2017 at Unknown time  . fluticasone (FLOVENT HFA) 220 MCG/ACT inhaler Inhale 2 puffs into the lungs daily.    07/26/2017 at Unknown time  . gabapentin (NEURONTIN) 300 MG capsule Take 300 mg by mouth 2 (two) times daily.    07/26/2017 at Unknown time  . guanFACINE (INTUNIV) 2 MG TB24 ER tablet Take 4 mg by mouth daily.   2 07/26/2017 at Unknown time  . montelukast (SINGULAIR) 10 MG tablet Take 1 tablet (10 mg total) by mouth at bedtime. (Patient not taking: Reported on 08/03/2016) 30 tablet 0 Not Taking at Unknown time  . montelukast (SINGULAIR) 5 MG chewable tablet Chew 5 mg by mouth at bedtime.   07/26/2017 at Unknown time  . naproxen (NAPROSYN) 375 MG tablet Take 1 tablet (375 mg total) by mouth 2 (two) times daily.  (Patient not taking: Reported on 07/27/2017) 20 tablet 0 Not Taking at Unknown time  . omeprazole (PRILOSEC) 10 MG capsule Take 1 capsule (10 mg total) by mouth daily. (Patient taking differently: Take 10 mg by mouth daily before breakfast. ) 30 capsule 0 07/26/2017 at Unknown time  . traZODone (DESYREL) 100 MG tablet Take 100 mg by mouth at bedtime.   07/26/2017 at Unknown time  . ziprasidone (GEODON) 80 MG capsule Take 80 mg by mouth 2 (two) times daily with a meal.   07/26/2017 at Unknown time    Patient Stressors: Marital or family conflict  Patient Strengths: Wellsite geologistCommunication skills General fund of knowledge Physical Health Special hobby/interest Supportive family/friends  Treatment Modalities: Medication Management, Group therapy, Case management,  1 to 1 session with clinician, Psychoeducation, Recreational therapy.   Physician Treatment Plan for Primary Diagnosis: MDD (major depressive disorder), recurrent severe, without psychosis (HCC) Long Term Goal(s): Improvement in symptoms so as ready for discharge Improvement in symptoms so as ready for discharge   Short Term Goals: Ability to identify changes in lifestyle to reduce recurrence of condition will improve Ability to verbalize feelings will improve Ability to disclose and discuss suicidal ideas Ability to demonstrate self-control will improve Ability to identify and develop effective coping behaviors will improve Ability to identify changes in lifestyle to reduce recurrence of condition will improve Ability to maintain clinical measurements within normal limits will improve  Medication Management: Evaluate patient's response, side effects, and tolerance of medication regimen.  Therapeutic Interventions: 1 to 1 sessions, Unit Group sessions and Medication administration.  Evaluation of Outcomes: Progressing  Physician Treatment Plan for Secondary Diagnosis: Principal Problem:   MDD (major depressive disorder), recurrent  severe, without psychosis (HCC)  Long Term Goal(s): Improvement in symptoms so as ready for discharge Improvement in symptoms so as ready for discharge   Short Term Goals: Ability to identify changes in lifestyle to reduce recurrence of condition will improve Ability to verbalize feelings will improve Ability to disclose and discuss suicidal ideas Ability to demonstrate self-control will improve Ability to identify and develop effective coping behaviors will improve Ability to identify changes in lifestyle to reduce recurrence of condition will improve Ability to maintain clinical measurements within normal limits will improve     Medication Management: Evaluate patient's response, side effects, and tolerance of medication regimen.  Therapeutic Interventions: 1 to 1 sessions, Unit Group sessions and Medication administration.  Evaluation of Outcomes: Progressing   RN Treatment Plan for Primary Diagnosis: MDD (major depressive disorder), recurrent severe, without psychosis (HCC) Long Term Goal(s): Knowledge of disease and therapeutic regimen to maintain health will improve  Short Term Goals:  Ability to remain free from injury will improve, Ability to verbalize feelings will improve, Ability to disclose and discuss suicidal ideas, Ability to identify and develop effective coping behaviors will improve and Compliance with prescribed medications will improve  Medication Management: RN will administer medications as ordered by provider, will assess and evaluate patient's response and provide education to patient for prescribed medication. RN will report any adverse and/or side effects to prescribing provider.  Therapeutic Interventions: 1 on 1 counseling sessions, Psychoeducation, Medication administration, Evaluate responses to treatment, Monitor vital signs and CBGs as ordered, Perform/monitor CIWA, COWS, AIMS and Fall Risk screenings as ordered, Perform wound care treatments as  ordered.  Evaluation of Outcomes: Progressing   LCSW Treatment Plan for Primary Diagnosis: MDD (major depressive disorder), recurrent severe, without psychosis (HCC) Long Term Goal(s): Safe transition to appropriate next level of care at discharge, Engage patient in therapeutic group addressing interpersonal concerns.  Short Term Goals: Engage patient in aftercare planning with referrals and resources, Increase social support, Increase ability to appropriately verbalize feelings, Increase emotional regulation, Identify triggers associated with mental health/substance abuse issues and Increase skills for wellness and recovery  Therapeutic Interventions: Assess for all discharge needs, 1 to 1 time with Social worker, Explore available resources and support systems, Assess for adequacy in community support network, Educate family and significant other(s) on suicide prevention, Complete Psychosocial Assessment, Interpersonal group therapy.  Evaluation of Outcomes: Progressing   Progress in Treatment: Attending groups: Yes. Participating in groups: Yes. Hyper-verbal and manic at times. Taking medication as prescribed: Yes. Toleration medication: Yes. Family/Significant other contact made: Yes, individual(s) contacted:  the patient's mother, Markes Shatswell Patient understands diagnosis: Yes. Discussing patient identified problems/goals with staff: Yes. Medical problems stabilized or resolved: Yes. Denies suicidal/homicidal ideation: No. Patient endorses SI to Clinical research associate. Issues/concerns per patient self-inventory: No.   New problem(s) identified: No, Describe:  CSW continuing to assess  New Short Term/Long Term Goal(s): Patient does not have a goal.   Discharge Plan or Barriers: Continuing to assess, patient may discharge home to mother.  Reason for Continuation of Hospitalization: Anxiety Depression Suicidal ideation  Estimated Length of Stay: Continuing to assess  Attendees: Patient:  Maurice [name] 08/03/2017 8:54 AM  Physician: Javier Docker 08/03/2017 8:54 AM  Nursing: Marchelle Folks RN; Liborio Nixon, RN 08/03/2017 8:54 AM  RN Care Manager: 08/03/2017 8:54 AM  Social Worker: Enid Cutter, Social Work Intern; Baldo Daub, LCSWA 08/03/2017 8:54 AM  Recreational Therapist: X 08/03/2017 8:54 AM  Other: X 08/03/2017 8:54 AM  Other: Juliann Pares 08/03/2017 8:54 AM  Other:X 08/03/2017 8:54 AM    Scribe for Treatment Team: Darreld Mclean, Student-Social Work 08/03/2017 8:54 AM

## 2017-08-03 NOTE — Progress Notes (Signed)
Pt presents with an animated affect and anxious mood. Pt rates depression 10/10, anxiety 10/10 and hopelessness 5/10. Pt denies SI/HI. Pt expressed to Clinical research associatewriter that he was nervous about meeting with his mother today for a family session. After family session, pt expressed to Clinical research associatewriter that he doesn't like his adoptive mother because she's mean and controlling. Pt expressed to writer that he had given away some of his clothing to another pt who didn't have any clothes. Pt mother spoke with this Clinical research associatewriter and asked for me to have pt get his belongings back from the other pt. Writer highly encouraged pt to get his belongings back at his mother's request. Orders reviewed with pt. Verbal support provided. Pt encouraged to attend groups. 15 minute checks performed for safety. Pt compliant with tx.

## 2017-08-03 NOTE — Progress Notes (Signed)
Patient mentioned that his day felt like a 2 out of 10. He explained that his mother got upset with him after he donated some clothing to one of his peers. He feels that he was right and that he was only trying to do a "good deed". The patient's goal for tomorrow is to stay positive and to participate in recreation therapy.

## 2017-08-03 NOTE — [facility] Group Notes (Signed)
[facility] LCSW Group Therapy Note  Date/Time: 08/03/17, 1315  Type of Therapy and Topic:  Group Therapy:  Overcoming Obstacles  Participation Level:  moderate  Description of Group:    In this group patients will be encouraged to explore what they see as obstacles to their own wellness and recovery. They will be guided to discuss their thoughts, feelings, and behaviors related to these obstacles. The group will process together ways to cope with barriers, with attention given to specific choices patients can make. Each patient will be challenged to identify changes they are motivated to make in order to overcome their obstacles. This group will be process-oriented, with patients participating in exploration of their own experiences as well as giving and receiving support and challenge from other group members.  Therapeutic Goals: 1. Patient will identify personal and current obstacles as they relate to admission. 2. Patient will identify barriers that currently interfere with their wellness or overcoming obstacles.  3. Patient will identify feelings, thought process and behaviors related to these barriers. 4. Patient will identify two changes they are willing to make to overcome these obstacles:    Summary of Patient Progress: Pt was mostly appropriate during group, made some comments during group discussion.  Shared that his relationship to his mom is his primary obstacle currently.      Therapeutic Modalities:   Cognitive Behavioral Therapy Solution Focused Therapy Motivational Interviewing Relapse Prevention Therapy  [name], LCSW

## 2017-08-03 NOTE — Progress Notes (Signed)
Recreation Therapy Notes  Date: 4.8.19 Time: 9:30 a.m. Location: [address] Dayroom   Group Topic: Stress Management   Goal Area(s) Addresses:  Goal 1.1: To reduce stress  -Patient will feel a reduction in stress level  -Patient will learn the importance of stress management  -Patient will participate during stress management group   Behavioral Response: Engaged   Intervention: Stress Management   Activity: Meditation- Patients were in a peaceful environment with soft lighting enhancing patients mood. Patients listened to a body scan meditation to help decrease tension and stress levels   Education: Stress Management, Discharge Planning.    Education Outcome: Acknowledges edcuation/In group clarification offered/Needs additional education   Clinical Observations/Feedback:: Patient attended and participated appropriately during stress management group treatment.    Sheryle Hailarian Jahkai Yandell, Recreation Therapy Intern   Sheryle HailDarian Arianne Klinge 08/03/2017 8:36 AM

## 2017-08-03 NOTE — Progress Notes (Addendum)
CSW left message with Salvatore MarvelKenosha [name], [phone], care coordinator [facility] regarding mother's request for higher level of care than group home. Maurice [name], MSW, LCSW Clinical Social Worker 08/03/2017 11:47 AM   CSW received call back from [place] and Maurice [name], IDD care coordinator [phone]).  Maurice [name] has one possible group home.  PRTF is not an option.  They are also working on respite care.  Maurice [name] was supposed to do a sex specific eval on Maurice [name] and [facility] and will give CSW a call.  Maurice [name] just got off the phone with Maurice mother/legal guardian and explained all this to her.  They are suggesting group home with more services.  They will call CSW back with [facility]. Maurice NashGregory Daanya Lanphier, MSW, LCSW Clinical Social Worker 08/03/2017 12:00 PM
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
## 2017-08-04 MED ORDER — AMLODIPINE BESYLATE 5 MG PO TABS
5.0000 mg | ORAL_TABLET | Freq: Every day | ORAL | Status: DC
  Administered 2017-08-04 – 2017-08-06 (×3): 5 mg via ORAL
  Filled 2017-08-04 (×6): qty 1

## 2017-08-04 MED ORDER — CLONIDINE HCL 0.1 MG PO TABS: 0.0500 mg | ORAL_TABLET | Freq: Two times a day (BID) | ORAL | Status: DC | PRN

## 2017-08-04 MED ORDER — GABAPENTIN 300 MG PO CAPS
300.0000 mg | ORAL_CAPSULE | Freq: Three times a day (TID) | ORAL | Status: DC
  Administered 2017-08-04 – 2017-08-06 (×6): 300 mg via ORAL
  Filled 2017-08-04 (×11): qty 1

## 2017-08-04 MED ORDER — NICOTINE POLACRILEX 2 MG MT GUM
2.0000 mg | CHEWING_GUM | OROMUCOSAL | Status: DC | PRN
  Administered 2017-08-04: 2 mg via ORAL

## 2017-08-04 NOTE — Plan of Care (Signed)
Nurse discussed depression, anxiety, coping skills with patient.  

## 2017-08-04 NOTE — Progress Notes (Signed)
Patient ID: Mariam DollarJaivon O Waldvogel, male   DOB: [birth year], 22 y.o.   MRN: [id number]   DAR Note: Pt very hyperactive on the unit. Pt did a little bit of running, dancing and pacing on the unit; required frequent redirecting. Pt complained of moderate headache, depression and anxiety; "I don't know if my depression is any better." Pt was med compliant. Denied SI/HI or AVH. All Pt's questions and concerns addressed. Support, encouragement, and safe environment provided.
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
## 2017-08-04 NOTE — [facility] Group Notes (Signed)
Pt attended spiritual care group on grief and loss facilitated by chaplain Burnis KingfisherMatthew Atonya Templer   Group opened with brief discussion and psycho-social ed around grief and loss in relationships and in relation to self - identifying life patterns, circumstances, changes that cause losses. Established group norms of support for self and others, and of speaking from own life experience. Group goal of establishing open and affirming space for members to share understanding of loss and experience with grief, normalize grief experience and provide psycho social education and grief support.  Group reflected on Worden's four tasks of grief and connected understanding of their own grief journey.  [name] was in group room, but slept throughout group.    [facility] / [facility] Chaplain Burnis KingfisherMatthew Kamaya Keckler, MDiv, [facility]

## 2017-08-04 NOTE — [facility] Group Notes (Signed)
Pt was invited but did not attend orientation/goals group. 

## 2017-08-04 NOTE — Progress Notes (Signed)
CSW spoke to legal guardian Maurice [name].  She spoke to both [facility] coordinators yesterday, she is aware that there is no PRTF option for Maurice [name] due to his age.  She is in agreement with putting in place some services to support Maurice [name] in a group home environment.  She is also aware that pt will need to discharge to her home while all of this is still in process and is OK with that. Garner NashGregory Keydi Giel, MSW, LCSW Clinical Social Worker 08/04/2017 2:41 PM

## 2017-08-04 NOTE — [facility] Group Notes (Signed)
Pt participated in recreation activity outside with other peers. 

## 2017-08-04 NOTE — Progress Notes (Signed)
Recreation Therapy Notes  Animal-Assisted Activity (AAA) Program Checklist/Progress Notes Patient Eligibility Criteria Checklist & Daily Group note for Rec Tx Intervention  Date: 4.9.19 Time: 2:45 p.m.  Location: [address] Dayroom   AAA/T Program Assumption of Risk Form signed by Patient/ or Parent Legal Guardian Yes  Patient is free of allergies or sever asthma Yes  Patient reports no fear of animals Yes  Patient reports no history of cruelty to animals Yes  Patient understands his/her participation is voluntary Yes  Patient washes hands before animal contact Yes  Patient washes hands after animal contact Yes  Behavioral Response: Appropriate   Education: Hand Washing, Appropriate Animal Interaction   Education Outcome: Acknowledges education.   Clinical Observations/Feedback: Patient attended session and interacted appropriately with therapy dog and peers. Patient asked appropriate questions about therapy dog and his training.   Andrick Rust, Recreation Therapy Intern  Maurice [name] 08/04/2017 2:03 PM 

## 2017-08-04 NOTE — Progress Notes (Addendum)
D:  Patient's self inventory sheet, patient rated anxiety #10.  Checked agitation on withdrawal area.  Denied physical problems.  Has experienced headache this morning.   A:  Medications administered per MD orders.  Emotional support and encouragement given patient. R:  Patient denied SI and HI, contracts for safety.  Denied A/V hallucinations.  Safety maintained with 15 minute checks.  Patient stated he had SI thoughts earlier this morning, but not now.   That he feels anxious.  Stated he had his mother argued yesterday which upset him.  Patient attended afternoon group.  Interacts with peers and staff.

## 2017-08-04 NOTE — [facility] Group Notes (Signed)
  [facility] LCSW Group Therapy Note  Date/Time: 08/04/17, 1315  Type of Therapy/Topic:  Group Therapy:  Emotion Regulation  Participation Level:  Minimal   Mood:pleasant  Description of Group:    The purpose of this group is to assist patients in learning to regulate negative emotions and experience positive emotions. Patients will be guided to discuss ways in which they have been vulnerable to their negative emotions. These vulnerabilities will be juxtaposed with experiences of positive emotions or situations, and patients challenged to use positive emotions to combat negative ones. Special emphasis will be placed on coping with negative emotions in conflict situations, and patients will process healthy conflict resolution skills.  Therapeutic Goals: 1. Patient will identify two positive emotions or experiences to reflect on in order to balance out negative emotions:  2. Patient will label two or more emotions that they find the most difficult to experience:  3. Patient will be able to demonstrate positive conflict resolution skills through discussion or role plays:   Summary of Patient Progress:Pt identified anger as an emotion that is difficult for him to experience.  Pt was not engaged during group discussion and slept at the end of the group.       Therapeutic Modalities:   Cognitive Behavioral Therapy Feelings Identification Dialectical Behavioral Therapy  Daleen SquibbGreg Caldonia Leap, LCSW

## 2017-08-04 NOTE — Progress Notes (Signed)
The patient attended group but refused to share.  

## 2017-08-04 NOTE — [facility] Group Notes (Signed)
[facility] Group Notes:  (Nursing/MHT/Case Management/Adjunct)  Date:  08/04/2017  Time:  3:45 pm  Type of Therapy:  Nurse Education  Participation Level:  Active  Participation Quality:  Appropriate  Affect:  Appropriate  Cognitive:  Appropriate  Insight:  Appropriate  Engagement in Group:  Engaged  Modes of Intervention:  Education  Summary of Progress/Problems:  Patient was alert and participated in group.      Earline MayotteKnight, Cornelia Walraven Shephard 08/04/2017, 5:52 PM

## 2017-08-05 NOTE — Progress Notes (Addendum)
Patient ID: Maurice [name], male   DOB: Jul 02, 1995, 22 y.o.   MRN: [id number]  Pt currently presents with a blunted affect and cooperative, hyperactive behavior. Pt reports to Clinical research associatewriter that he "had a visit from a group home today." Reports it went well. Pt reports a headache today, 5/10 on scale. Pt refuses his nebulizing treatment, no audible wheezes heard, respirations even and unlabored. Pt reports good sleep with current medication regimen.   Pt provided with medications per providers orders. Pt's labs and vitals were monitored throughout the night. Pt given a 1:1 about emotional and mental status. Pt supported and encouraged to express concerns and questions. Pt educated on medications and assertiveness techniques.   Pt's safety ensured with 15 minute and environmental checks. Pt currently denies SI/HI and A/V hallucinations. Pt verbally agrees to seek staff if SI/HI or A/VH occurs and to consult with staff before acting on any harmful thoughts. Pt remains hypertensive, currently asymptomatic. Will continue POC.

## 2017-08-05 NOTE — [facility] Group Notes (Signed)
[facility] Group Notes:  (Nursing/MHT/Case Management/Adjunct)  Date:  08/05/2017  Time:  1330   Type of Therapy:  Nurse Education  Participation Level:  Active  Participation Quality:  Appropriate and Attentive  Affect:  Appropriate and Excited  Cognitive:  Alert, Appropriate and Oriented  Insight:  Appropriate and Improving  Engagement in Group:  Developing/Improving and Supportive  Modes of Intervention:  Discussion, Education, Exploration and Support  Summary of Progress/Problems: pt participated and contributed to the discussion of positive self talk.  Suszanne ConnersMichael R Poet Hineman 08/05/2017, 4:18 PM

## 2017-08-05 NOTE — Progress Notes (Signed)
Patient ID: Maurice [name], male   DOB: [birth year], 22 y.o.   MRN: [id number] DAR Note: Pt continue to be childish and hyperactive on the unit. Pt required frequent redirecting. Pt complained of moderate depression and anxiety; "I feel better." Pt was med compliant. Denied SI/HI or AVH. All Pt's questions and concerns addressed. Support, encouragement, and safe environment provided.

## 2017-08-05 NOTE — [facility] Group Notes (Signed)
[facility] Group Therapy  08/05/2017  ? Type of Therapy: [facility] Presentation ? Participation Level: Active ? Participation Quality: Attentive ? Affect: Appropriate ? Cognitive: Oriented ? Insight: Developing/Improving ? Engagement in Therapy: Engaged ? Modes of Intervention: Discussion, Education and Socialization ? Summary of Progress/Problems: [facility] [employer] ([facility]) Speaker came to talk about his personal journey with mental health. The pt processed ways by which to relate to the speaker. MHA speaker provided handouts and educational information pertaining to groups and services offered by the MHA. Pt was engaged in speaker's presentation and was receptive to resources provided.              

## 2017-08-05 NOTE — Plan of Care (Signed)
Problem: Health Behavior/Discharge Planning: Goal: Compliance with treatment plan for underlying cause of condition will improve Intervention: Patient encouraged to take medications as prescribed and attend groups. Patient encouraged to be active in their recovery. Outcome: Patient is attending groups, taking medications as prescribed and complying with goals of treatment. 08/05/2017 9:44 AM - Progressing by Ferrel Loganollazo, Aramis Weil A, RN   Problem: Safety: Goal: Periods of time without injury will increase Intervention: Patient contracts for safety on the unit. Low fall risk precautions in place. Safety monitored with q15 minute checks. Outcome: Patient remains safe on the unit at this time. 08/05/2017 9:44 AM - Progressing by Ferrel Loganollazo, Austin Herd A, RN

## 2017-08-05 NOTE — Progress Notes (Signed)
Adult Psychoeducational Group Note  Date:  08/05/2017 Time:  9:42 PM  Group Topic/Focus:  Wrap-Up Group:   The focus of this group is to help patients review their daily goal of treatment and discuss progress on daily workbooks.  Participation Level:  Active  Participation Quality:  Appropriate  Affect:  Appropriate  Cognitive:  Alert and Oriented  Insight: Good  Engagement in Group:  Engaged  Modes of Intervention:  Discussion  Additional Comments:  Pt attended group today. Pt rated his day a 2/10.  Leo GrosserMegan A Rylend Pietrzak 08/05/2017, 9:42 PM

## 2017-08-05 NOTE — Progress Notes (Signed)
Recreation Therapy Notes  Date: 4.10.19 Time: 9:30 a.m. Location: [address] Dayroom   Group Topic: Stress Management   Goal Area(s) Addresses:  Goal 1.1: To reduce stress  -Patient will feel a reduction in stress level  -Patient will learn the importance of stress management  -Patient will participate during stress management group      Intervention: Stress Management  Activity: Meditation- Patients were in a peaceful environment with soft lighting enhancing patients mood. Patients listened to a deep concentration meditation to help decrease stress levels   Education: Stress Management, Discharge Planning.    Education Outcome: Acknowledges edcuation/In group clarification offered/Needs additional education   Clinical Observations/Feedback:: Patient did not attend     Leonard Hendler, Recreation Therapy Intern   Ceirra Belli 08/05/2017 8:42 AM 

## 2017-08-05 NOTE — Progress Notes (Signed)
[facility] MD Progress Note  08/05/2017 2:42 PM GREYDEN BESECKER  MRN:  [id number]   Subjective: Patient reports that he is doing much better today.  He denies any SI/HI/AVH.  He reports that he was feeling depressed this morning but used his coping skills to improve his thoughts.  He continues to blame his mother for him feeling depressed.  Patient states that he was very impressed with his group home meeting today.  "The guy was very nice and had a good personality.  I like somebody who has a good personality."  Patient reports that he is very interested in going to his group home as it is in [place], [place].  Objective: Patient's chart and findings reviewed and discussed with treatment team.  Patient presents in the day room after attending group.  Patient has been seen interacting with peers and staff appropriately.  Patient has been seen going to the gym and outside to play basketball.  Patient has been very active and engaged with others in conversation.  Patient is pleasant and cooperative.  Patient has appeared to be more calm than he was upon arrival on the unit and will contribute that to stopping the Intuniv.  Patient was screened by a group home today.  CSW will be communicating with group home for patient placement.  We will continue all medications as prescribed for patient  Principal Problem: MDD (major depressive disorder), recurrent severe, without psychosis (HCC) Diagnosis:   Patient Active Problem List   Diagnosis Date Noted  . MDD (major depressive disorder), recurrent severe, without psychosis (HCC) [F33.2] 07/28/2017  . Aggressive behavior [R46.89] 11/30/2015  . Hypertension [I10]   . Intentional overdose of drug in tablet form (HCC) [T50.902A]   . Other secondary hypertension [I15.8]   . Somnolence [R40.0]   . Overdose [T50.901A] 11/09/2015  . GERD (gastroesophageal reflux disease) [K21.9] 07/20/2015  . Appetite lost [R63.0] 07/20/2015  . Suicidal ideation [R45.851]   .  DMDD (disruptive mood dysregulation disorder) (HCC) [F34.81] 03/13/2015  . Homicidal ideations [R45.850] 03/13/2015  . Intellectual disability [F79] 02/26/2015  . Attention-deficit hyperactivity disorder, predominantly hyperactive type [F90.1]   . ADHD (attention deficit hyperactivity disorder), combined type [F90.2] 01/31/2015  . Oppositional defiant disorder [F91.3] 01/31/2015  . Mood disorder (HCC) [F39] 01/31/2015   Total Time spent with patient: 15 minutes  Past Psychiatric History: See H&P  Past Medical History:  Past Medical History:  Diagnosis Date  . ADHD (attention deficit hyperactivity disorder)   . Allergy   . Appetite lost 07/20/2015  . Asthma   . GERD (gastroesophageal reflux disease) 07/20/2015  . Homicidal ideations   . Intellectual disability 02/26/2015  . ODD (oppositional defiant disorder)   . Suicidal ideations    History reviewed. No pertinent surgical history. Family History:  Family History  Adopted: Yes   Family Psychiatric  History: See H&P Social History:  Social History   Substance and Sexual Activity  Alcohol Use Yes   Comment: I mix henessy with gatorade     Social History   Substance and Sexual Activity  Drug Use No    Social History   Socioeconomic History  . Marital status: Single    Spouse name: Not on file  . Number of children: Not on file  . Years of education: Not on file  . Highest education level: Not on file  Occupational History  . Not on file  Social Needs  . Financial resource strain: Not on file  . Food insecurity:  Worry: Not on file    Inability: Not on file  . Transportation needs:    Medical: Not on file    Non-medical: Not on file  Tobacco Use  . Smoking status: Never Smoker  . Smokeless tobacco: Never Used  Substance and Sexual Activity  . Alcohol use: Yes    Comment: I mix henessy with gatorade  . Drug use: No  . Sexual activity: Yes    Birth control/protection: Condom  Lifestyle  . Physical  activity:    Days per week: Not on file    Minutes per session: Not on file  . Stress: Not on file  Relationships  . Social connections:    Talks on phone: Not on file    Gets together: Not on file    Attends religious service: Not on file    Active member of club or organization: Not on file    Attends meetings of clubs or organizations: Not on file    Relationship status: Not on file  Other Topics Concern  . Not on file  Social History Narrative  . Not on file   Additional Social History:                         Sleep: Good  Appetite:  Good  Current Medications: Current Facility-Administered Medications  Medication Dose Route Frequency Provider Last Rate Last Dose  . acetaminophen (TYLENOL) tablet 650 mg  650 mg Oral Q6H PRN Laveda AbbeParks, Laurie Britton, NP   650 mg at 08/04/17 0915  . albuterol (PROVENTIL HFA;VENTOLIN HFA) 108 (90 Base) MCG/ACT inhaler 2 puff  2 puff Inhalation Q6H PRN Laveda AbbeParks, Laurie Britton, NP      . alum & mag hydroxide-simeth (MAALOX/MYLANTA) 200-200-20 MG/5ML suspension 30 mL  30 mL Oral Q4H PRN Laveda AbbeParks, Laurie Britton, NP      . amLODipine (NORVASC) tablet 5 mg  5 mg Oral Daily Antonieta Pertlary, Greg Lawson, MD   5 mg at 08/05/17 0756  . budesonide (PULMICORT) nebulizer solution 0.5 mg  0.5 mg Nebulization BID Laveda AbbeParks, Laurie Britton, NP   0.5 mg at 08/04/17 [id number]  . cloNIDine (CATAPRES) tablet 0.05 mg  0.05 mg Oral BID PRN Antonieta Pertlary, Greg Lawson, MD      . fluticasone [facility](FLONASE) 50 MCG/ACT nasal spray 1 spray  1 spray Each Nare Daily PRN Laveda AbbeParks, Laurie Britton, NP      . gabapentin (NEURONTIN) capsule 300 mg  300 mg Oral TID Antonieta Pertlary, Greg Lawson, MD   300 mg at 08/05/17 1315  . loratadine (CLARITIN) tablet 10 mg  10 mg Oral Daily Laveda AbbeParks, Laurie Britton, NP   10 mg at 08/05/17 0756  . LORazepam (ATIVAN) tablet 0.5 mg  0.5 mg Oral Q6H PRN Cobos, Rockey SituFernando A, MD   0.5 mg at 08/04/17 2047  . magnesium hydroxide (MILK OF MAGNESIA) suspension 30 mL  30 mL Oral Daily PRN Laveda AbbeParks,  Laurie Britton, NP      . menthol-cetylpyridinium (CEPACOL) lozenge 3 mg  1 lozenge Oral PRN Nira ConnBerry, Jason A, NP      . montelukast (SINGULAIR) chewable tablet 5 mg  5 mg Oral [id number] Laurali Goddard, Gerlene Burdockravis B, [name]   5 mg at 08/05/17 0756  . nicotine polacrilex (NICORETTE) gum 2 mg  2 mg Oral PRN Cobos, Rockey SituFernando A, MD   2 mg at 08/04/17 1431  . pantoprazole (PROTONIX) EC tablet 40 mg  40 mg Oral Daily Laveda AbbeParks, Laurie Britton, NP   40 mg at 08/05/17 0756  .  traZODone (DESYREL) tablet 50 mg  50 mg Oral QHS PRN Cobos, Rockey Situ, MD   50 mg at 08/04/17 2047  . ziprasidone (GEODON) capsule 80 mg  80 mg Oral BID WC Laveda Abbe, NP   80 mg at 08/05/17 1610    Lab Results: No results found for this or any previous visit (from the past 48 hour(s)).  Blood Alcohol level:  Lab Results  Component Value Date   ETH <10 07/27/2017   ETH <5 08/03/2016    Metabolic Disorder Labs: Lab Results  Component Value Date   HGBA1C 5.8 (H) 07/29/2017   MPG 119.76 07/29/2017   No results found for: PROLACTIN Lab Results  Component Value Date   CHOL 156 07/29/2017   TRIG 50 07/29/2017   HDL 62 07/29/2017   CHOLHDL 2.5 07/29/2017   VLDL 10 07/29/2017   LDLCALC 84 07/29/2017    Physical Findings: AIMS: Facial and Oral Movements Muscles of Facial Expression: None, normal Lips and Perioral Area: None, normal Jaw: None, normal Tongue: None, normal,Extremity Movements Upper (arms, wrists, hands, fingers): None, normal Lower (legs, knees, ankles, toes): None, normal, Trunk Movements Neck, shoulders, hips: None, normal, Overall Severity Severity of abnormal movements (highest score from questions above): None, normal Incapacitation due to abnormal movements: None, normal Patient's awareness of abnormal movements (rate only patient's report): No Awareness, Dental Status Current problems with teeth and/or dentures?: No Does patient usually wear dentures?: No  CIWA:  CIWA-Ar Total: 1 COWS:  COWS Total Score:  2  Musculoskeletal: Strength & Muscle Tone: within normal limits Gait & Station: normal Patient leans: N/A  Psychiatric Specialty Exam: Physical Exam  ROS  Blood pressure (!) 157/89, pulse (!) 115, temperature 98.2 F (36.8 C), temperature source Oral, resp. rate 18, height 5\' 7"  (1.702 m), weight 67.1 kg (148 lb).Body mass index is 23.18 kg/m.  General Appearance: Casual  Eye Contact:  Good  Speech:  Clear and Coherent and Normal Rate  Volume:  Increased  Mood:  Euthymic  Affect:  Congruent  Thought Process:  Goal Directed and Descriptions of Associations: Intact  Orientation:  Full (Time, Place, and Person)  Thought Content:  WDL  Suicidal Thoughts:  No  Homicidal Thoughts:  No  Memory:  Immediate;   Good Recent;   Good Remote;   Good  Judgement:  Impaired  Insight:  Lacking  Psychomotor Activity:  Normal  Concentration:  Concentration: Good and Attention Span: Good  Recall:  Good  Fund of Knowledge:  Good  Language:  Good  Akathisia:  No  Handed:  Right  AIMS (if indicated):     Assets:  Communication Skills Desire for Improvement Financial Resources/Insurance Physical Health Social Support  ADL's:  Intact  Cognition:  WNL  Sleep:  Number of Hours: 6.25   Problems addressed MDD severe recurrent  Treatment Plan Summary: Daily contact with patient to assess and evaluate symptoms and progress in treatment, Medication management and Plan is to:  -Continue gabapentin 300 mg p.o. 3 times daily for agitation -Continue Ativan 0.5 mg every 6 hours as needed for anxiety -Continue clonidine 0.05 mg twice daily as needed for agitation -Continue Geodon 80 mg p.o. twice daily with meals for mood stability -Continue trazodone 50 mg p.o. nightly as needed for insomnia -FL2 form provided to CSW for group home -Encourage group therapy participation  Maryfrances Bunnell, [name] 08/05/2017, 2:42 PM

## 2017-08-05 NOTE — Progress Notes (Signed)
Nursing Progress Note [phone]  Data: On initial approach, patient was in bed after breakfast. Patient did come up for his morning medications. Patient did attend his morning groups. Patient denies SI/HI/AVH or pain. Patient contracts for safety on the unit at this time. Patient completed self-inventory sheet and rate depression, hopelessness, and  anxiety 5,5,10 respectively. Patient declines to make goals for the day. Patient presents with limited insight and childlike behaviors. Patient is working on discharge plans and had an interview with a group home today.  Action: Patient educated about and provided medication per provider's orders. Patient safety maintained with q15 min safety checks. Low fall risk precautions in place. Emotional support given. 1:1 interaction and active listening provided. Patient encouraged to attend meals and groups. Labs, vital signs and patient behavior monitored throughout shift. Patient encouraged to work on treatment plan and goals.  Response: Patient remains safe on the unit at this time. Will continue to support and monitor.

## 2017-08-06 LAB — COMPREHENSIVE METABOLIC PANEL
ALBUMIN: 4.3 g/dL (ref 3.5–5.0)
ALK PHOS: 141 U/L — AB (ref 38–126)
ALT: 53 U/L (ref 17–63)
AST: 66 U/L — AB (ref 15–41)
Anion gap: 11 (ref 5–15)
BILIRUBIN TOTAL: 0.3 mg/dL (ref 0.3–1.2)
BUN: 14 mg/dL (ref 6–20)
CALCIUM: 9.4 mg/dL (ref 8.9–10.3)
CO2: 23 mmol/L (ref 22–32)
Chloride: 104 mmol/L (ref 101–111)
Creatinine, Ser: 1.31 mg/dL — ABNORMAL HIGH (ref 0.61–1.24)
GFR calc Af Amer: >60 mL/min (ref >60)
GFR calc non Af Amer: >60 mL/min (ref >60)
Glucose, Bld: 110 mg/dL — ABNORMAL HIGH (ref 65–99)
Potassium: 4.4 mmol/L (ref 3.5–5.1)
SODIUM: 138 mmol/L (ref 135–145)
TOTAL PROTEIN: 8.3 g/dL — AB (ref 6.5–8.1)

## 2017-08-06 MED ORDER — GABAPENTIN 400 MG PO CAPS
400.0000 mg | ORAL_CAPSULE | Freq: Three times a day (TID) | ORAL | Status: DC
  Administered 2017-08-06 – 2017-08-09 (×8): 400 mg via ORAL
  Filled 2017-08-06 (×16): qty 1

## 2017-08-06 MED ORDER — PROPRANOLOL HCL 10 MG PO TABS
10.0000 mg | ORAL_TABLET | Freq: Three times a day (TID) | ORAL | Status: DC
  Administered 2017-08-06 – 2017-08-08 (×6): 10 mg via ORAL
  Filled 2017-08-06 (×13): qty 1

## 2017-08-06 MED ORDER — LORAZEPAM 1 MG PO TABS
1.0000 mg | ORAL_TABLET | ORAL | Status: AC
  Administered 2017-08-06: 1 mg via ORAL
  Filled 2017-08-06: qty 1

## 2017-08-06 NOTE — Progress Notes (Signed)
Patient approached nurses's station with a loud, angry tone and was cursing at staff. Patient reports he believes people were "talking about me like I was a dog" while playing basketball. Patient requested towels and returned to his room for a shower. New orders received. Patient medicated as prescribed.

## 2017-08-06 NOTE — Progress Notes (Signed)
[facility] MD Progress Note  08/06/2017 1:08 PM Maurice [name]  MRN:  [id number]   Subjective: Patient reports that he is doing much better today.  Patient reports that he feels good today and is excited to be going to group home soon.  Patient is concerned about going home before going to the group home.  He would prefer to leave straight from the hospital and go to the group home.  Patient denies any SI/HI/AVH.  Patient denies any depression or anxiety at this time.  Objective: Patient's chart and findings reviewed and discussed with treatment team.  Patient presents in the day room attending group.  Patient is pleasant and cooperative.  Patient is seen being very interactive and engaged with peers and staff.  Patient has continued to improve his behavior and mood.  Patient's blood pressure has continued to remain high as well as his heart rate.  Contacted legal guardian, his mother, and will stop the Norvasc and start Inderal 10 mg 3 times a day.  Also discussed with Dr. Jola Babinski and if Inderal does not control blood pressure will start lisinopril tomorrow.  Will order CMP today.  Will encourage patient to stay in hospital until discharged to group home due to concerns of decompensation with the ongoing issues with his mother.  Principal Problem: MDD (major depressive disorder), recurrent severe, without psychosis (HCC) Diagnosis:   Patient Active Problem List   Diagnosis Date Noted  . MDD (major depressive disorder), recurrent severe, without psychosis (HCC) [F33.2] 07/28/2017  . Aggressive behavior [R46.89] 11/30/2015  . Hypertension [I10]   . Intentional overdose of drug in tablet form (HCC) [T50.902A]   . Other secondary hypertension [I15.8]   . Somnolence [R40.0]   . Overdose [T50.901A] 11/09/2015  . GERD (gastroesophageal reflux disease) [K21.9] 07/20/2015  . Appetite lost [R63.0] 07/20/2015  . Suicidal ideation [R45.851]   . DMDD (disruptive mood dysregulation disorder) (HCC) [F34.81]  03/13/2015  . Homicidal ideations [R45.850] 03/13/2015  . Intellectual disability [F79] 02/26/2015  . Attention-deficit hyperactivity disorder, predominantly hyperactive type [F90.1]   . ADHD (attention deficit hyperactivity disorder), combined type [F90.2] 01/31/2015  . Oppositional defiant disorder [F91.3] 01/31/2015  . Mood disorder (HCC) [F39] 01/31/2015   Total Time spent with patient: 30 minutes  Past Psychiatric History: See H&P  Past Medical History:  Past Medical History:  Diagnosis Date  . ADHD (attention deficit hyperactivity disorder)   . Allergy   . Appetite lost 07/20/2015  . Asthma   . GERD (gastroesophageal reflux disease) 07/20/2015  . Homicidal ideations   . Intellectual disability 02/26/2015  . ODD (oppositional defiant disorder)   . Suicidal ideations    History reviewed. No pertinent surgical history. Family History:  Family History  Adopted: Yes   Family Psychiatric  History: See H&P Social History:  Social History   Substance and Sexual Activity  Alcohol Use Yes   Comment: I mix henessy with gatorade     Social History   Substance and Sexual Activity  Drug Use No    Social History   Socioeconomic History  . Marital status: Single    Spouse name: Not on file  . Number of children: Not on file  . Years of education: Not on file  . Highest education level: Not on file  Occupational History  . Not on file  Social Needs  . Financial resource strain: Not on file  . Food insecurity:    Worry: Not on file    Inability: Not on file  .  Transportation needs:    Medical: Not on file    Non-medical: Not on file  Tobacco Use  . Smoking status: Never Smoker  . Smokeless tobacco: Never Used  Substance and Sexual Activity  . Alcohol use: Yes    Comment: I mix henessy with gatorade  . Drug use: No  . Sexual activity: Yes    Birth control/protection: Condom  Lifestyle  . Physical activity:    Days per week: Not on file    Minutes per session:  Not on file  . Stress: Not on file  Relationships  . Social connections:    Talks on phone: Not on file    Gets together: Not on file    Attends religious service: Not on file    Active member of club or organization: Not on file    Attends meetings of clubs or organizations: Not on file    Relationship status: Not on file  Other Topics Concern  . Not on file  Social History Narrative  . Not on file   Additional Social History:                         Sleep: Good  Appetite:  Good  Current Medications: Current Facility-Administered Medications  Medication Dose Route Frequency Provider Last Rate Last Dose  . acetaminophen (TYLENOL) tablet 650 mg  650 mg Oral Q6H PRN Laveda AbbeParks, Laurie Britton, NP   650 mg at 08/05/17 1952  . albuterol (PROVENTIL HFA;VENTOLIN HFA) 108 (90 Base) MCG/ACT inhaler 2 puff  2 puff Inhalation Q6H PRN Laveda AbbeParks, Laurie Britton, NP      . alum & mag hydroxide-simeth (MAALOX/MYLANTA) 200-200-20 MG/5ML suspension 30 mL  30 mL Oral Q4H PRN Laveda AbbeParks, Laurie Britton, NP      . budesonide (PULMICORT) nebulizer solution 0.5 mg  0.5 mg Nebulization BID Laveda AbbeParks, Laurie Britton, NP   0.5 mg at 08/04/17 [id number]  . cloNIDine (CATAPRES) tablet 0.05 mg  0.05 mg Oral BID PRN Antonieta Pertlary, Greg Lawson, MD      . fluticasone [facility](FLONASE) 50 MCG/ACT nasal spray 1 spray  1 spray Each Nare Daily PRN Laveda AbbeParks, Laurie Britton, NP      . gabapentin (NEURONTIN) capsule 300 mg  300 mg Oral TID Antonieta Pertlary, Greg Lawson, MD   300 mg at 08/06/17 1300  . loratadine (CLARITIN) tablet 10 mg  10 mg Oral Daily Laveda AbbeParks, Laurie Britton, NP   10 mg at 08/06/17 0755  . LORazepam (ATIVAN) tablet 0.5 mg  0.5 mg Oral Q6H PRN Cobos, Rockey SituFernando A, MD   0.5 mg at 08/04/17 2047  . magnesium hydroxide (MILK OF MAGNESIA) suspension 30 mL  30 mL Oral Daily PRN Laveda AbbeParks, Laurie Britton, NP      . menthol-cetylpyridinium (CEPACOL) lozenge 3 mg  1 lozenge Oral PRN Nira ConnBerry, Jason A, NP      . montelukast (SINGULAIR) chewable tablet 5 mg  5 mg  Oral [id number]BH-[id number] Natanael Saladin, Gerlene Burdockravis B, [name]   5 mg at 08/06/17 0755  . nicotine polacrilex (NICORETTE) gum 2 mg  2 mg Oral PRN Cobos, Rockey SituFernando A, MD   2 mg at 08/04/17 1431  . pantoprazole (PROTONIX) EC tablet 40 mg  40 mg Oral Daily Laveda AbbeParks, Laurie Britton, NP   40 mg at 08/06/17 0755  . propranolol (INDERAL) tablet 10 mg  10 mg Oral TID Carsin Randazzo, Gerlene Burdockravis B, [name]      . traZODone (DESYREL) tablet 50 mg  50 mg Oral QHS PRN Cobos, Rockey SituFernando A,  MD   50 mg at 08/04/17 2047  . ziprasidone (GEODON) capsule 80 mg  80 mg Oral BID WC Laveda Abbe, NP   80 mg at 08/06/17 1610    Lab Results: No results found for this or any previous visit (from the past 48 hour(s)).  Blood Alcohol level:  Lab Results  Component Value Date   ETH <10 07/27/2017   ETH <5 08/03/2016    Metabolic Disorder Labs: Lab Results  Component Value Date   HGBA1C 5.8 (H) 07/29/2017   MPG 119.76 07/29/2017   No results found for: PROLACTIN Lab Results  Component Value Date   CHOL 156 07/29/2017   TRIG 50 07/29/2017   HDL 62 07/29/2017   CHOLHDL 2.5 07/29/2017   VLDL 10 07/29/2017   LDLCALC 84 07/29/2017    Physical Findings: AIMS: Facial and Oral Movements Muscles of Facial Expression: None, normal Lips and Perioral Area: None, normal Jaw: None, normal Tongue: None, normal,Extremity Movements Upper (arms, wrists, hands, fingers): None, normal Lower (legs, knees, ankles, toes): None, normal, Trunk Movements Neck, shoulders, hips: None, normal, Overall Severity Severity of abnormal movements (highest score from questions above): None, normal Incapacitation due to abnormal movements: None, normal Patient's awareness of abnormal movements (rate only patient's report): No Awareness, Dental Status Current problems with teeth and/or dentures?: No Does patient usually wear dentures?: No  CIWA:  CIWA-Ar Total: 1 COWS:  COWS Total Score: 2  Musculoskeletal: Strength & Muscle Tone: within normal limits Gait & Station:  normal Patient leans: N/A  Psychiatric Specialty Exam: Physical Exam  Nursing note and vitals reviewed. Constitutional: He is oriented to person, place, and time. He appears well-developed and well-nourished.  Respiratory: Effort normal.  Musculoskeletal: Normal range of motion.  Neurological: He is alert and oriented to person, place, and time.  Skin: Skin is warm.    Review of Systems  Constitutional: Negative.   HENT: Negative.   Eyes: Negative.   Respiratory: Negative.   Cardiovascular: Negative.   Gastrointestinal: Negative.   Genitourinary: Negative.   Musculoskeletal: Negative.   Skin: Negative.   Neurological: Negative.   Endo/Heme/Allergies: Negative.   Psychiatric/Behavioral: Negative.     Blood pressure 139/75, pulse (!) 134, temperature 98.4 F (36.9 C), temperature source Oral, resp. rate 18, height 5\' 7"  (1.702 m), weight 67.1 kg (148 lb), SpO2 98 %.Body mass index is 23.18 kg/m.  General Appearance: Casual  Eye Contact:  Good  Speech:  Clear and Coherent and Normal Rate  Volume:  Normal  Mood:  Euthymic  Affect:  Congruent  Thought Process:  Goal Directed and Descriptions of Associations: Intact  Orientation:  Full (Time, Place, and Person)  Thought Content:  WDL  Suicidal Thoughts:  No  Homicidal Thoughts:  No  Memory:  Immediate;   Good Recent;   Good Remote;   Good  Judgement:  Impaired  Insight:  Lacking  Psychomotor Activity:  Normal  Concentration:  Concentration: Good and Attention Span: Good  Recall:  Good  Fund of Knowledge:  Good  Language:  Good  Akathisia:  No  Handed:  Right  AIMS (if indicated):     Assets:  Communication Skills Desire for Improvement Financial Resources/Insurance Physical Health Social Support  ADL's:  Intact  Cognition:  WNL  Sleep:  Number of Hours: 6.75   Problems addressed MDD severe recurrent HTN  Treatment Plan Summary: Daily contact with patient to assess and evaluate symptoms and progress in  treatment, Medication management and Plan is to:  -  Start Inderal 10 mg PO TID for tachycardia, HTN, and anxiety -Stop Norvasc -May use Lisinopril if Inderal alone doesn't control BP -Continue gabapentin 300 mg p.o. 3 times daily for agitation -Continue Ativan 0.5 mg every 6 hours as needed for anxiety -Continue clonidine 0.05 mg twice daily as needed for agitation -Continue Geodon 80 mg p.o. twice daily with meals for mood stability -CMP ordered -Continue trazodone 50 mg p.o. nightly as needed for insomnia -Potential discharge to [facility] on Monday -Encourage group therapy participation  Maryfrances Bunnell, [name] 08/06/2017, 1:08 PM

## 2017-08-06 NOTE — Progress Notes (Signed)
Adult Psychoeducational Group Note  Date:  08/06/2017 Time:  5:17 PM  Group Topic/Focus:  Goals Group:   The focus of this group is to help patients establish daily goals to achieve during treatment and discuss how the patient can incorporate goal setting into their daily lives to aide in recovery.  Participation Level:  Minimal  Participation Quality:  Inattentive  Affect:  Irritable  Cognitive:  Lacking  Insight: Lacking  Engagement in Group:  Limited  Modes of Intervention:  Activity  Additional Comments:  Pt did attend group but had minimal participation in group activity.  [name] R Shiquita Collignon 08/06/2017, 5:17 PM

## 2017-08-06 NOTE — Progress Notes (Signed)
  DATA ACTION RESPONSE  Objective- Pt. is visible in the dayroom, seen interacting loudly with peers.  Presents with an animated  affect and mood. Pt remains hyperactive and intrusive. Needs redirection from staff at times.C/o of insomnia this evening.  Subjective- Denies having any SI/HI/AVH at this time. Rates pain 8/10; Headache.  Is cooperative and remains safe on the unit.  1:1 interaction in private to establish rapport. Encouragement, education, & support given from staff.  PRN tylenol and trazodone requested and will re-eval accordingly.   Safety maintained with Q 15 checks. Continue with POC.

## 2017-08-06 NOTE — [facility] Group Notes (Signed)
Adult Psychoeducational Group Note  Date:  08/06/2017 Time:  11:53 PM  Group Topic/Focus:  Wrap-Up Group:   The focus of this group is to help patients review their daily goal of treatment and discuss progress on daily workbooks.  Participation Level:  Active  Participation Quality:  Appropriate and Attentive  Affect:  Appropriate  Cognitive:  Alert and Appropriate  Insight: Appropriate and Good  Engagement in Group:  Engaged  Modes of Intervention:  Discussion and Education  Additional Comments:  Pt attended and participated in wrap up group this evening. Pt had a good day even though they had a blow up today while playing outside. Pt goal is to be good while they are here.   Maurice NettersOctavia A Audray [name] 08/06/2017, 11:53 PM

## 2017-08-06 NOTE — [facility] Group Notes (Signed)
LCSW Group Therapy Note  08/06/2017 1:15pm  Type of Therapy/Topic:  Group Therapy:  Balance in Life  Participation Level:  Active -Hyperactive, manic, often off topic  Description of Group:    This group will address the concept of balance and how it feels and looks when one is unbalanced. Patients will be encouraged to process areas in their lives that are out of balance and identify reasons for remaining unbalanced. Facilitators will guide patients in utilizing problem-solving interventions to address and correct the stressor making their life unbalanced. Understanding and applying boundaries will be explored and addressed for obtaining and maintaining a balanced life. Patients will be encouraged to explore ways to assertively make their unbalanced needs known to significant others in their lives, using other group members and facilitator for support and feedback.  Therapeutic Goals: 1. Patient will identify two or more emotions or situations they have that consume much of in their lives. 2. Patient will identify signs/triggers that life has become out of balance:  3. Patient will identify two ways to set boundaries in order to achieve balance in their lives:  4. Patient will demonstrate ability to communicate their needs through discussion and/or role plays  Summary of Patient Progress: Patient monopolized group discussions and went on tangents about being "protected by angels," becoming a millionaire in [place], and buying a mansion for all of his biological family to live in.   Patient responded to some redirection. Patient identified family and spirituality as areas of his life that feel most imbalanced at this time.   Therapeutic Modalities:   Cognitive Behavioral Therapy Solution-Focused Therapy Assertiveness Training  Darreld McleanCharlotte C Jermiah Soderman, Student-Social Work 08/06/2017 1:07 PM

## 2017-08-06 NOTE — Progress Notes (Addendum)
CSW spoke to [employer]Crystal at [facility].  They are working with pt care coordinator on the admission.  Best guess of time frame would be early to mid next week: 4/15 or 4/17. Garner NashGregory Samayah Novinger, MSW, LCSW Clinical Social Worker 08/06/2017 9:41 AM   CSW spoke to mother/legal guardian Sandrea HammondWendy Corprew.  She is meeting with Marita KansasVernon from [facility] this afternoon to sign paperwork and will call with an update after this is complete. Garner NashGregory Jenisa Monty, MSW, LCSW Clinical Social Worker 08/06/2017 12:35 PM   CSW received call from care coordinator Nino GlowLynn Beaty and pt mother Sandrea HammondWendy Hoffart.  Group home is accepting pt but the process for getting funding approved by [facility] will take some time. Larita FifeLynn unable to give an estimate but would only say it "won't be next week."  Mother is agreeing to take pt home while the approval is in process. Garner NashGregory Strummer Canipe, MSW, LCSW Clinical Social Worker 08/06/2017 3:54 PM

## 2017-08-06 NOTE — Progress Notes (Signed)
Nursing Progress Note [id number]  Data: Patient presents with silly behavior and is interactive with peers and staff. Patient is hyperactive at times requiring redirection by staff but is cooperative today. Patient complaint with scheduled medications. Patient denies SI/HI/AVH or pain. Patient contracts for safety on the unit at this time. Patient completed self-inventory sheet and rates depression, hopelessness, and anxiety 3,5,5 respectively. Patient states goal for today is to ". Patient with continued elevated BP and HR. Provider notified and new orders received and patient medicated as prescribed.  Action: Patient educated about and provided medication per provider's orders. Patient safety maintained with q15 min safety checks. Low fall risk precautions in place. Emotional support given. 1:1 interaction and active listening provided. Patient encouraged to attend meals and groups. Labs, vital signs and patient behavior monitored throughout shift. Patient encouraged to work on treatment plan and goals.  Response: Patient remains safe on the unit at this time. Patient is interacting with peers appropriately on the unit. Will continue to support and monitor.

## 2017-08-07 MED ORDER — TUBERCULIN PPD 5 UNIT/0.1ML ID SOLN
5.0000 [IU] | Freq: Once | INTRADERMAL | Status: AC
  Administered 2017-08-07: 5 [IU] via INTRADERMAL

## 2017-08-07 NOTE — Tx Team (Signed)
Interdisciplinary Treatment and Diagnostic Plan Update  08/07/2017 Time of Session: 0850 Mariam DollarJaivon O Ermis MRN: [id number]  Principal Diagnosis: MDD (major depressive disorder), recurrent severe, without psychosis (HCC)  Secondary Diagnoses: Principal Problem:   MDD (major depressive disorder), recurrent severe, without psychosis (HCC) Active Problems:   Hypertension   Current Medications:  Current Facility-Administered Medications  Medication Dose Route Frequency Provider Last Rate Last Dose  . acetaminophen (TYLENOL) tablet 650 mg  650 mg Oral Q6H PRN Laveda AbbeParks, Laurie Britton, NP   650 mg at 08/06/17 2121  . albuterol (PROVENTIL HFA;VENTOLIN HFA) 108 (90 Base) MCG/ACT inhaler 2 puff  2 puff Inhalation Q6H PRN Laveda AbbeParks, Laurie Britton, NP      . alum & mag hydroxide-simeth (MAALOX/MYLANTA) 200-200-20 MG/5ML suspension 30 mL  30 mL Oral Q4H PRN Laveda AbbeParks, Laurie Britton, NP      . budesonide (PULMICORT) nebulizer solution 0.5 mg  0.5 mg Nebulization BID Laveda AbbeParks, Laurie Britton, NP   0.5 mg at 08/04/17 [id number]  . cloNIDine (CATAPRES) tablet 0.05 mg  0.05 mg Oral BID PRN Antonieta Pertlary, Greg Lawson, MD      . fluticasone [facility](FLONASE) 50 MCG/ACT nasal spray 1 spray  1 spray Each Nare Daily PRN Laveda AbbeParks, Laurie Britton, NP      . gabapentin (NEURONTIN) capsule 400 mg  400 mg Oral TID Antonieta Pertlary, Greg Lawson, MD   400 mg at 08/07/17 0755  . loratadine (CLARITIN) tablet 10 mg  10 mg Oral Daily Laveda AbbeParks, Laurie Britton, NP   10 mg at 08/07/17 0755  . LORazepam (ATIVAN) tablet 0.5 mg  0.5 mg Oral Q6H PRN Cobos, Rockey SituFernando A, MD   0.5 mg at 08/04/17 2047  . magnesium hydroxide (MILK OF MAGNESIA) suspension 30 mL  30 mL Oral Daily PRN Laveda AbbeParks, Laurie Britton, NP      . menthol-cetylpyridinium (CEPACOL) lozenge 3 mg  1 lozenge Oral PRN Nira ConnBerry, Jason A, NP      . montelukast (SINGULAIR) chewable tablet 5 mg  5 mg Oral [address] Money, Feliz Beamravis B, [name]   5 mg at 08/07/17 0600  . nicotine polacrilex (NICORETTE) gum 2 mg  2 mg Oral PRN Cobos, Rockey SituFernando A,  MD   2 mg at 08/04/17 1431  . pantoprazole (PROTONIX) EC tablet 40 mg  40 mg Oral Daily Laveda AbbeParks, Laurie Britton, NP   40 mg at 08/07/17 0755  . propranolol (INDERAL) tablet 10 mg  10 mg Oral TID Money, Gerlene Burdockravis B, [name]   10 mg at 08/07/17 0755  . traZODone (DESYREL) tablet 50 mg  50 mg Oral QHS PRN Cobos, Rockey SituFernando A, MD   50 mg at 08/06/17 2120  . tuberculin injection 5 Units  5 Units Intradermal Once Antonieta Pertlary, Greg Lawson, MD      . ziprasidone (GEODON) capsule 80 mg  80 mg Oral BID WC Laveda AbbeParks, Laurie Britton, NP   80 mg at 08/07/17 0755   PTA Medications: Medications Prior to Admission  Medication Sig Dispense Refill Last Dose  . albuterol (PROAIR HFA) 108 (90 Base) MCG/ACT inhaler Inhale 2 puffs into the lungs every 6 (six) hours as needed for wheezing or shortness of breath. 1 Inhaler 0 Past Month at Unknown time  . cetirizine (ZYRTEC) 10 MG tablet Take 1 tablet (10 mg total) by mouth daily. 30 tablet 0 07/26/2017 at Unknown time  . clindamycin-benzoyl peroxide (BENZACLIN) gel Apply 1 application topically See admin instructions. Apply thin amount to face every other night (alternate with differin gel)   Past Week at Unknown time  .  fluticasone (FLONASE) 50 MCG/ACT nasal spray Place 1 spray into both nostrils daily. (Patient taking differently: Place 1 spray into both nostrils daily as needed for allergies. ) 16 g 0 07/26/2017 at Unknown time  . fluticasone (FLOVENT HFA) 220 MCG/ACT inhaler Inhale 2 puffs into the lungs daily.    07/26/2017 at Unknown time  . gabapentin (NEURONTIN) 300 MG capsule Take 300 mg by mouth 2 (two) times daily.    07/26/2017 at Unknown time  . guanFACINE (INTUNIV) 2 MG TB24 ER tablet Take 4 mg by mouth daily.   2 07/26/2017 at Unknown time  . montelukast (SINGULAIR) 10 MG tablet Take 1 tablet (10 mg total) by mouth at bedtime. (Patient not taking: Reported on 08/03/2016) 30 tablet 0 Not Taking at Unknown time  . montelukast (SINGULAIR) 5 MG chewable tablet Chew 5 mg by mouth at  bedtime.   07/26/2017 at Unknown time  . naproxen (NAPROSYN) 375 MG tablet Take 1 tablet (375 mg total) by mouth 2 (two) times daily. (Patient not taking: Reported on 07/27/2017) 20 tablet 0 Not Taking at Unknown time  . omeprazole (PRILOSEC) 10 MG capsule Take 1 capsule (10 mg total) by mouth daily. (Patient taking differently: Take 10 mg by mouth daily before breakfast. ) 30 capsule 0 07/26/2017 at Unknown time  . traZODone (DESYREL) 100 MG tablet Take 100 mg by mouth at bedtime.   07/26/2017 at Unknown time  . ziprasidone (GEODON) 80 MG capsule Take 80 mg by mouth 2 (two) times daily with a meal.   07/26/2017 at Unknown time    Patient Stressors: Marital or family conflict  Patient Strengths: Wellsite geologist fund of knowledge Physical Health Special hobby/interest Supportive family/friends  Treatment Modalities: Medication Management, Group therapy, Case management,  1 to 1 session with clinician, Psychoeducation, Recreational therapy.   Physician Treatment Plan for Primary Diagnosis: MDD (major depressive disorder), recurrent severe, without psychosis (HCC) Long Term Goal(s): Improvement in symptoms so as ready for discharge Improvement in symptoms so as ready for discharge   Short Term Goals: Ability to identify changes in lifestyle to reduce recurrence of condition will improve Ability to verbalize feelings will improve Ability to disclose and discuss suicidal ideas Ability to demonstrate self-control will improve Ability to identify and develop effective coping behaviors will improve Ability to identify changes in lifestyle to reduce recurrence of condition will improve Ability to maintain clinical measurements within normal limits will improve  Medication Management: Evaluate patient's response, side effects, and tolerance of medication regimen.  Therapeutic Interventions: 1 to 1 sessions, Unit Group sessions and Medication administration.  Evaluation of Outcomes:  Progressing  Physician Treatment Plan for Secondary Diagnosis: Principal Problem:   MDD (major depressive disorder), recurrent severe, without psychosis (HCC) Active Problems:   Hypertension  Long Term Goal(s): Improvement in symptoms so as ready for discharge Improvement in symptoms so as ready for discharge   Short Term Goals: Ability to identify changes in lifestyle to reduce recurrence of condition will improve Ability to verbalize feelings will improve Ability to disclose and discuss suicidal ideas Ability to demonstrate self-control will improve Ability to identify and develop effective coping behaviors will improve Ability to identify changes in lifestyle to reduce recurrence of condition will improve Ability to maintain clinical measurements within normal limits will improve     Medication Management: Evaluate patient's response, side effects, and tolerance of medication regimen.  Therapeutic Interventions: 1 to 1 sessions, Unit Group sessions and Medication administration.  Evaluation of Outcomes: Progressing  RN Treatment Plan for Primary Diagnosis: MDD (major depressive disorder), recurrent severe, without psychosis (HCC) Long Term Goal(s): Knowledge of disease and therapeutic regimen to maintain health will improve  Short Term Goals: Ability to remain free from injury will improve, Ability to verbalize feelings will improve, Ability to disclose and discuss suicidal ideas, Ability to identify and develop effective coping behaviors will improve and Compliance with prescribed medications will improve  Medication Management: RN will administer medications as ordered by provider, will assess and evaluate patient's response and provide education to patient for prescribed medication. RN will report any adverse and/or side effects to prescribing provider.  Therapeutic Interventions: 1 on 1 counseling sessions, Psychoeducation, Medication administration, Evaluate responses to  treatment, Monitor vital signs and CBGs as ordered, Perform/monitor CIWA, COWS, AIMS and Fall Risk screenings as ordered, Perform wound care treatments as ordered.  Evaluation of Outcomes: Progressing   LCSW Treatment Plan for Primary Diagnosis: MDD (major depressive disorder), recurrent severe, without psychosis (HCC) Long Term Goal(s): Safe transition to appropriate next level of care at discharge, Engage patient in therapeutic group addressing interpersonal concerns.  Short Term Goals: Engage patient in aftercare planning with referrals and resources, Increase social support, Increase ability to appropriately verbalize feelings, Increase emotional regulation, Identify triggers associated with mental health/substance abuse issues and Increase skills for wellness and recovery  Therapeutic Interventions: Assess for all discharge needs, 1 to 1 time with Social worker, Explore available resources and support systems, Assess for adequacy in community support network, Educate family and significant other(s) on suicide prevention, Complete Psychosocial Assessment, Interpersonal group therapy.  Evaluation of Outcomes: Progressing   Progress in Treatment: Attending groups: Yes. Participating in groups: Yes. Hyper-verbal and manic at times. Taking medication as prescribed: Yes. Toleration medication: Yes. Family/Significant other contact made: Yes, individual(s) contacted:  the patient's mother, Armond Cuthrell Patient understands diagnosis: Yes. Discussing patient identified problems/goals with staff: Yes. Medical problems stabilized or resolved: Yes. Denies suicidal/homicidal ideation: No. Patient endorses SI to Clinical research associate. Issues/concerns per patient self-inventory: No.   New problem(s) identified: No, Describe:  CSW continuing to assess  New Short Term/Long Term Goal(s): Patient does not have a goal.   Discharge Plan or Barriers: Continuing to assess, patient may discharge home to  mother.  Reason for Continuation of Hospitalization: Anxiety Depression Suicidal ideation  Estimated Length of Stay: 2-4 days  Attendees: Patient: 08/07/2017   Physician: Dr Jola Babinski, MD 08/07/2017   Nursing: Lamount Cranker, RN 08/07/2017   RN Care Manager: 08/07/2017   Social Worker: Daleen Squibb, LCSW 08/07/2017   Recreational Therapist:  08/07/2017   Other:  08/07/2017   Other:  08/07/2017        Scribe for Treatment Team: Darreld Mclean, Student-Social Work 08/07/2017 10:05 AM

## 2017-08-07 NOTE — [facility] Group Notes (Signed)
[facility] LCSW Group Therapy Note  Date/Time: 08/07/17, 1315  Type of Therapy and Topic:  Group Therapy:  Feelings around Relapse and Recovery  Participation Level:  None   Mood: somewhat silly  Description of Group:    Patients in this group will discuss emotions they experience before and after a relapse. They will process how experiencing these feelings, or avoidance of experiencing them, relates to having a relapse. Facilitator will guide patients to explore emotions they have related to recovery. Patients will be encouraged to process which emotions are more powerful. They will be guided to discuss the emotional reaction significant others in their lives may have to patients' relapse or recovery. Patients will be assisted in exploring ways to respond to the emotions of others without this contributing to a relapse.  Therapeutic Goals: 1. Patient will identify two or more emotions that lead to relapse for them:  2. Patient will identify two emotions that result when they relapse:  3. Patient will identify two emotions related to recovery:  4. Patient will demonstrate ability to communicate their needs through discussion and/or role plays.   Summary of Patient Progress:Pt came to group late and then mostly laughed at comments made by other group members.  Pt did not engage in the topic being discussed.     Therapeutic Modalities:   Cognitive Behavioral Therapy Solution-Focused Therapy Assertiveness Training Relapse Prevention Therapy  Daleen SquibbGreg Hearl Heikes, LCSW

## 2017-08-07 NOTE — Progress Notes (Signed)
[facility] MD Progress Note  08/07/2017 11:21 AM Maurice [name]  MRN:  [id number] Subjective: Patient is seen and examined.  Patient is a 22 year old male with a past psychiatric history significant for depression, aggressive behavior, oppositional defiant disorder, attention deficit disorder, intellectual disability and suicidal ideation.  He seen in follow-up.  He was seen by Mr Money yesterday, and in the early morning seem to be doing well.  He was out interacting with others, pleasant and polite.  Later in the afternoon he became significantly upset.  I tried to talk to him about it yesterday afternoon, but he refused.  He was irritable and pacing.  He received as needed Ativan, and I increased his Neurontin to 400 mg p.o. 3 times daily.  This morning he wanted to talk to me early and we discussed what it occurred yesterday.  He stated that he had gone outside and was playing basketball and 1 of the recreation therapist made a comment about his ability to play basketball.  He took that to heart.  He became very impulsive and that is when he got very irritable.  He did calm down after he received the Ativan.  We discussed that today.  We discussed means by which he can calm down before he gets so upset.  Social work discussed today the fact that it will probably take several weeks for Medicaid to work out the paperwork for him to be able to go to the group home that interviewed him.  We discussed contacting his mother and seeing if there is any way that home could be a transition point for him to get to the group home wants it is approved.  The patient is mildly anxious this morning, but no evidence of self-destructive behavior, or agitation as of yesterday afternoon.  He did state that the increased Neurontin was making him "a bit dizzy".  This may also be due to the propranolol that was added yesterday.  The amlodipine was stopped, and propranolol was added for his blood pressure.  His blood pressure does look  better today. Principal Problem: MDD (major depressive disorder), recurrent severe, without psychosis ([place]) Diagnosis:   Patient Active Problem List   Diagnosis Date Noted  . MDD (major depressive disorder), recurrent severe, without psychosis ([place]) [F33.2] 07/28/2017  . Aggressive behavior [R46.89] 11/30/2015  . Hypertension [I10]   . Intentional overdose of drug in tablet form ([place]) [T50.902A]   . Other secondary hypertension [I15.8]   . Somnolence [R40.0]   . Overdose [T50.901A] 11/09/2015  . GERD (gastroesophageal reflux disease) [K21.9] 07/20/2015  . Appetite lost [R63.0] 07/20/2015  . Suicidal ideation [R45.851]   . DMDD (disruptive mood dysregulation disorder) ([place]) [F34.81] 03/13/2015  . Homicidal ideations [R45.850] 03/13/2015  . Intellectual disability [F79] 02/26/2015  . Attention-deficit hyperactivity disorder, predominantly hyperactive type [F90.1]   . ADHD (attention deficit hyperactivity disorder), combined type [F90.2] 01/31/2015  . Oppositional defiant disorder [F91.3] 01/31/2015  . Mood disorder ([place]) [F39] 01/31/2015   Total Time spent with patient: 20 minutes  Past Psychiatric History: See admission H&P  Past Medical History:  Past Medical History:  Diagnosis Date  . ADHD (attention deficit hyperactivity disorder)   . Allergy   . Appetite lost 07/20/2015  . Asthma   . GERD (gastroesophageal reflux disease) 07/20/2015  . Homicidal ideations   . Intellectual disability 02/26/2015  . ODD (oppositional defiant disorder)   . Suicidal ideations    History reviewed. No pertinent surgical history. Family History:  Family History  Adopted: Yes   Family Psychiatric  History: See admission H&P Social History:  Social History   Substance and Sexual Activity  Alcohol Use Yes   Comment: I mix henessy with gatorade     Social History   Substance and Sexual Activity  Drug Use No    Social History   Socioeconomic History  . Marital status: Single     Spouse name: Not on file  . Number of children: Not on file  . Years of education: Not on file  . Highest education level: Not on file  Occupational History  . Not on file  Social Needs  . Financial resource strain: Not on file  . Food insecurity:    Worry: Not on file    Inability: Not on file  . Transportation needs:    Medical: Not on file    Non-medical: Not on file  Tobacco Use  . Smoking status: Never Smoker  . Smokeless tobacco: Never Used  Substance and Sexual Activity  . Alcohol use: Yes    Comment: I mix henessy with gatorade  . Drug use: No  . Sexual activity: Yes    Birth control/protection: Condom  Lifestyle  . Physical activity:    Days per week: Not on file    Minutes per session: Not on file  . Stress: Not on file  Relationships  . Social connections:    Talks on phone: Not on file    Gets together: Not on file    Attends religious service: Not on file    Active member of club or organization: Not on file    Attends meetings of clubs or organizations: Not on file    Relationship status: Not on file  Other Topics Concern  . Not on file  Social History Narrative  . Not on file   Additional Social History:                         Sleep: Good  Appetite:  Good  Current Medications: Current Facility-Administered Medications  Medication Dose Route Frequency Provider Last Rate Last Dose  . acetaminophen (TYLENOL) tablet 650 mg  650 mg Oral Q6H PRN Ethelene Hal, NP   650 mg at 08/06/17 2121  . albuterol (PROVENTIL HFA;VENTOLIN HFA) 108 (90 Base) MCG/ACT inhaler 2 puff  2 puff Inhalation Q6H PRN Ethelene Hal, NP      . alum & mag hydroxide-simeth (MAALOX/MYLANTA) 200-200-20 MG/5ML suspension 30 mL  30 mL Oral Q4H PRN Ethelene Hal, NP      . budesonide (PULMICORT) nebulizer solution 0.5 mg  0.5 mg Nebulization BID Ethelene Hal, NP   0.5 mg at 08/04/17 3500  . cloNIDine (CATAPRES) tablet 0.05 mg  0.05 mg Oral BID  PRN Sharma Covert, MD      . fluticasone [facility]) 50 MCG/ACT nasal spray 1 spray  1 spray Each Nare Daily PRN Ethelene Hal, NP      . gabapentin (NEURONTIN) capsule 400 mg  400 mg Oral TID Sharma Covert, MD   400 mg at 08/07/17 0755  . loratadine (CLARITIN) tablet 10 mg  10 mg Oral Daily Ethelene Hal, NP   10 mg at 08/07/17 0755  . LORazepam (ATIVAN) tablet 0.5 mg  0.5 mg Oral Q6H PRN Cobos, Myer Peer, MD   0.5 mg at 08/04/17 2047  . magnesium hydroxide (MILK OF MAGNESIA) suspension 30 mL  30 mL Oral Daily PRN Romilda Garret,  Billey Chang, NP      . menthol-cetylpyridinium (CEPACOL) lozenge 3 mg  1 lozenge Oral PRN Lindon Romp A, NP      . montelukast (SINGULAIR) chewable tablet 5 mg  5 mg Oral [address] Lowry Ram, [name]   5 mg at 08/07/17 0600  . nicotine polacrilex (NICORETTE) gum 2 mg  2 mg Oral PRN Cobos, Myer Peer, MD   2 mg at 08/04/17 1431  . pantoprazole (PROTONIX) EC tablet 40 mg  40 mg Oral Daily Ethelene Hal, NP   40 mg at 08/07/17 0755  . propranolol (INDERAL) tablet 10 mg  10 mg Oral TID Money, Lowry Ram, [name]   10 mg at 08/07/17 0755  . traZODone (DESYREL) tablet 50 mg  50 mg Oral QHS PRN Cobos, Myer Peer, MD   50 mg at 08/06/17 2120  . tuberculin injection 5 Units  5 Units Intradermal Once Sharma Covert, MD      . ziprasidone (GEODON) capsule 80 mg  80 mg Oral BID WC Ethelene Hal, NP   80 mg at 08/07/17 0755    Lab Results:  Results for orders placed or performed during the hospital encounter of 07/28/17 (from the past 48 hour(s))  Comprehensive metabolic panel     Status: Abnormal   Collection Time: 08/06/17  6:30 PM  Result Value Ref Range   Sodium 138 135 - 145 mmol/L   Potassium 4.4 3.5 - 5.1 mmol/L   Chloride 104 101 - 111 mmol/L   CO2 23 22 - 32 mmol/L   Glucose, Bld 110 (H) 65 - 99 mg/dL   BUN 14 6 - 20 mg/dL   Creatinine, Ser 1.31 (H) 0.61 - 1.24 mg/dL   Calcium 9.4 8.9 - 10.3 mg/dL   Total Protein 8.3 (H) 6.5 - 8.1  g/dL   Albumin 4.3 3.5 - 5.0 g/dL   AST 66 (H) 15 - 41 U/L   ALT 53 17 - 63 U/L   Alkaline Phosphatase 141 (H) 38 - 126 U/L   Total Bilirubin 0.3 0.3 - 1.2 mg/dL   GFR calc non Af Amer >60 >60 mL/min   GFR calc Af Amer >60 >60 mL/min    Comment: (NOTE) The eGFR has been calculated using the CKD EPI equation. This calculation has not been validated in all clinical situations. eGFR's persistently <60 mL/min signify possible Chronic Kidney Disease.    Anion gap 11 5 - 15    Comment: Performed at [facility], [place] [address] [place], [place] [address]    Blood Alcohol level:  Lab Results  Component Value Date   [facility] <10 07/27/2017   ETH <5 60/45/4098    Metabolic Disorder Labs: Lab Results  Component Value Date   HGBA1C 5.8 (H) 07/29/2017   MPG 119.76 07/29/2017   No results found for: PROLACTIN Lab Results  Component Value Date   CHOL 156 07/29/2017   TRIG 50 07/29/2017   HDL 62 07/29/2017   CHOLHDL 2.5 07/29/2017   VLDL 10 07/29/2017   LDLCALC 84 07/29/2017    Physical Findings: AIMS: Facial and Oral Movements Muscles of Facial Expression: None, normal Lips and Perioral Area: None, normal Jaw: None, normal Tongue: None, normal,Extremity Movements Upper (arms, wrists, hands, fingers): None, normal Lower (legs, knees, ankles, toes): None, normal, Trunk Movements Neck, shoulders, hips: None, normal, Overall Severity Severity of abnormal movements (highest score from questions above): None, normal Incapacitation due to abnormal movements: None, normal Patient's awareness of abnormal  movements (rate only patient's report): No Awareness, Dental Status Current problems with teeth and/or dentures?: No Does patient usually wear dentures?: No  CIWA:  CIWA-Ar Total: 1 COWS:  COWS Total Score: 2  Musculoskeletal: Strength & Muscle Tone: within normal limits Gait & Station: normal Patient leans: N/A  Psychiatric Specialty Exam: Physical Exam   Constitutional: He is oriented to person, place, and time. He appears well-developed and well-nourished.  HENT:  Head: Normocephalic and atraumatic.  Respiratory: Effort normal.  Musculoskeletal: Normal range of motion.  Neurological: He is alert and oriented to person, place, and time.    ROS  Blood pressure 139/78, pulse 95, temperature 98.1 F (36.7 C), temperature source Oral, resp. rate 16, height 5' 7"  (1.702 m), weight 67.1 kg (148 lb), SpO2 98 %.Body mass index is 23.18 kg/m.  General Appearance: Casual  Eye Contact:  Fair  Speech:  Clear and Coherent  Volume:  Normal  Mood:  Anxious  Affect:  Congruent  Thought Process:  Linear  Orientation:  Full (Time, Place, and Person)  Thought Content:  Logical  Suicidal Thoughts:  No  Homicidal Thoughts:  No  Memory:  Immediate;   Fair  Judgement:  Fair  Insight:  Fair  Psychomotor Activity:  Increased  Concentration:  Concentration: Fair  Recall:  [employer] of Knowledge:  Fair  Language:  Fair  Akathisia:  No  Handed:  Right  AIMS (if indicated):     Assets:  Desire for Improvement Physical Health Social Support  ADL's:  Intact  Cognition:  WNL  Sleep:  Number of Hours: 6.5     Treatment Plan Summary: Daily contact with patient to assess and evaluate symptoms and progress in treatment, Medication management and Plan Patient is seen and examined.  Patient is a 22 year old male with the above-stated past psychiatric history seen in follow-up.  He is much calmer than yesterday.  He got significantly anxious.  I increased his Neurontin to 400 mg p.o. 3 times daily.  He does believe that it may be making him a bit dizzy, but that may also be due to the propranolol that was added yesterday 10 mg p.o. 3 times daily.  His blood pressure does look good today, and we will going to get an EKG on him given the propranolol with the ziprasidone.  No changes in his medications today and we will monitor how his mood and impulse control  is throughout the course of the hospitalization.  His blood pressure is better on the propranolol versus the amlodipine, and I am not going to change that for now.  He will continue with the clonidine as needed for anxiety, agitation as well as elevated blood pressure.  We will continue to hope that the group home situation is much faster than it appears, but we will begin to develop alternative discharge planning.  Sharma Covert, MD 08/07/2017, 11:21 AM

## 2017-08-07 NOTE — Progress Notes (Signed)
DATA ACTION RESPONSE  Objective- Pt. is visible in the dayroom, seen interacting loudly with peers.Presents with an animated  affect and mood. Pt is calmer this evening.C/o of insomnia.  Subjective- Denies having any SI/HI/AVH at this time. Rates pain 7/10; Headache. Is cooperative and remains safe on the unit.  1:1 interaction in private to establish rapport. Encouragement, education, & support given from staff.  PRN tylenol and trazodone requested and will re-eval accordingly.   Safety maintained with Q 15 checks. Continue with POC.

## 2017-08-07 NOTE — Progress Notes (Signed)
Pt attend wrap up group. Pt said his day was a 2. He has no goals. Staff remind pt everyone upon addmisson has a goal. Pt very disrespectful during group. RN notified.

## 2017-08-07 NOTE — Progress Notes (Signed)
D) Pt has been upset today on and off over his basketball skills. States, "I am very sensitive and I don't like people making comments about me. I am a good basketball player and a good football player. I get really angry when people laugh at me"  Pt was able to identify that he allows people to get under his skin. Rates his depression at a 5, hopelessness at a 10 and anxiety at a 5. Pt was given his TB in left forearm, to be read Sunday April 14th, 2019. Also Pt was given an EKG which was shared with his physician. A) Pt provided with a lengthy 1:1 and his feelings were explored. Did some role play with Pt which he was able to understand. Given support, reassurance and appropriate praise along with encouragement. R) Pt has limited understanding and poor insight into his behavior.

## 2017-08-07 NOTE — Progress Notes (Signed)
Recreation Therapy Notes  Date: 4.12.19 Time: 9:30 a.m. Location: [address] Dayroom   Group Topic: Stress Management   Goal Area(s) Addresses:  Goal 1.1: To reduce stress  -Patient will feel a reduction in stress level  -Patient will understand the importance of stress management  -Patient will participate during stress management group      Intervention: Stress Management  Activity: Meditation- Patients were in a peaceful environment with soft lighting enhancing patients mood. Patients listened to a body scan meditation on the calm app to help decrease tension and stress levels   Education: Stress Management, Discharge Planning.    Education Outcome: Acknowledges edcuation/In group clarification offered/Needs additional education   Clinical Observations/Feedback:: Patient did not attend     Sheryle HailDarian Paulita Licklider, Recreation Therapy Intern   Sheryle HailDarian Johm Pfannenstiel 08/07/2017 8:48 AM

## 2017-08-08 MED ORDER — PROPRANOLOL HCL 20 MG PO TABS
20.0000 mg | ORAL_TABLET | Freq: Three times a day (TID) | ORAL | Status: DC
  Administered 2017-08-08 – 2017-08-09 (×2): 20 mg via ORAL
  Filled 2017-08-08 (×8): qty 1

## 2017-08-08 NOTE — [facility] Group Notes (Signed)
LCSW Group Therapy Note 08/08/2017 1:45 PM  Type of Therapy/Topic: Group Therapy: Emotion Regulation  Participation Level: Minimal   Description of Group:  The purpose of this group is to assist patients in learning to regulate negative emotions and experience positive emotions. Patients will be guided to discuss ways in which they have been vulnerable to their negative emotions. These vulnerabilities will be juxtaposed with experiences of positive emotions or situations, and patients will be challenged to use positive emotions to combat negative ones. Special emphasis will be placed on coping with negative emotions in conflict situations, and patients will process healthy conflict resolution skills.  Therapeutic Goals: 1. Patient will identify two positive emotions or experiences to reflect on in order to balance out negative emotions 2. Patient will label two or more emotions that they find the most difficult to experience 3. Patient will demonstrate positive conflict resolution skills through discussion and/or role plays  Summary of Patient Progress:  Maurice [name] was engaged initially during the group session, however he excused himself due to being very drowsy due to his medications.    Therapeutic Modalities:  Cognitive Behavioral Therapy Feelings Identification Dialectical Behavioral Therapy   Alcario DroughtJolan Lyvonne Cassell LCSWA Clinical Social Worker

## 2017-08-08 NOTE — Progress Notes (Signed)
[facility] MD Progress Note  08/08/2017 1:39 PM Maurice [name]  MRN:  [id number] Subjective: Patient is seen and examined.  Patient is a 22 year old male with a past psychiatric history significant for depression, aggressive behavior, oppositional defiant disorder, attention deficit disorder, intellectual disability as well as some suicidal ideation.  He is seen in follow-up.  He is having a pretty good day today except for the fact he has a headache.  His blood pressure remains mildly elevated.  He has not had any irritability today.  He has not had any outbursts.  He stated that he is ready to "play basketball as soon as this headache goes away".  He denied any suicidal ideation or side effects to his current medications.  We discussed increasing his propranolol a little bit for the blood pressure to see if that would reduce his headache as well.  He denied any other side effects to his medications. Principal Problem: MDD (major depressive disorder), recurrent severe, without psychosis ([place]) Diagnosis:   Patient Active Problem List   Diagnosis Date Noted  . MDD (major depressive disorder), recurrent severe, without psychosis ([place]) [F33.2] 07/28/2017  . Aggressive behavior [R46.89] 11/30/2015  . Hypertension [I10]   . Intentional overdose of drug in tablet form ([place]) [T50.902A]   . Other secondary hypertension [I15.8]   . Somnolence [R40.0]   . Overdose [T50.901A] 11/09/2015  . GERD (gastroesophageal reflux disease) [K21.9] 07/20/2015  . Appetite lost [R63.0] 07/20/2015  . Suicidal ideation [R45.851]   . DMDD (disruptive mood dysregulation disorder) ([place]) [F34.81] 03/13/2015  . Homicidal ideations [R45.850] 03/13/2015  . Intellectual disability [F79] 02/26/2015  . Attention-deficit hyperactivity disorder, predominantly hyperactive type [F90.1]   . ADHD (attention deficit hyperactivity disorder), combined type [F90.2] 01/31/2015  . Oppositional defiant disorder [F91.3] 01/31/2015  . Mood disorder  ([place]) [F39] 01/31/2015   Total Time spent with patient: 20 minutes  Past Psychiatric History: See admission H&P  Past Medical History:  Past Medical History:  Diagnosis Date  . ADHD (attention deficit hyperactivity disorder)   . Allergy   . Appetite lost 07/20/2015  . Asthma   . GERD (gastroesophageal reflux disease) 07/20/2015  . Homicidal ideations   . Intellectual disability 02/26/2015  . ODD (oppositional defiant disorder)   . Suicidal ideations    History reviewed. No pertinent surgical history. Family History:  Family History  Adopted: Yes   Family Psychiatric  History: See admission H&P Social History:  Social History   Substance and Sexual Activity  Alcohol Use Yes   Comment: I mix henessy with gatorade     Social History   Substance and Sexual Activity  Drug Use No    Social History   Socioeconomic History  . Marital status: Single    Spouse name: Not on file  . Number of children: Not on file  . Years of education: Not on file  . Highest education level: Not on file  Occupational History  . Not on file  Social Needs  . Financial resource strain: Not on file  . Food insecurity:    Worry: Not on file    Inability: Not on file  . Transportation needs:    Medical: Not on file    Non-medical: Not on file  Tobacco Use  . Smoking status: Never Smoker  . Smokeless tobacco: Never Used  Substance and Sexual Activity  . Alcohol use: Yes    Comment: I mix henessy with gatorade  . Drug use: No  . Sexual activity: Yes  Birth control/protection: Condom  Lifestyle  . Physical activity:    Days per week: Not on file    Minutes per session: Not on file  . Stress: Not on file  Relationships  . Social connections:    Talks on phone: Not on file    Gets together: Not on file    Attends religious service: Not on file    Active member of club or organization: Not on file    Attends meetings of clubs or organizations: Not on file    Relationship status:  Not on file  Other Topics Concern  . Not on file  Social History Narrative  . Not on file   Additional Social History:                         Sleep: Good  Appetite:  Good  Current Medications: Current Facility-Administered Medications  Medication Dose Route Frequency Provider Last Rate Last Dose  . acetaminophen (TYLENOL) tablet 650 mg  650 mg Oral Q6H PRN Ethelene Hal, NP   650 mg at 08/07/17 2138  . albuterol (PROVENTIL HFA;VENTOLIN HFA) 108 (90 Base) MCG/ACT inhaler 2 puff  2 puff Inhalation Q6H PRN Ethelene Hal, NP      . alum & mag hydroxide-simeth (MAALOX/MYLANTA) 200-200-20 MG/5ML suspension 30 mL  30 mL Oral Q4H PRN Ethelene Hal, NP      . budesonide (PULMICORT) nebulizer solution 0.5 mg  0.5 mg Nebulization BID Ethelene Hal, NP   0.5 mg at 08/08/17 0849  . cloNIDine (CATAPRES) tablet 0.05 mg  0.05 mg Oral BID PRN Sharma Covert, MD      . fluticasone [facility]) 50 MCG/ACT nasal spray 1 spray  1 spray Each Nare Daily PRN Ethelene Hal, NP      . gabapentin (NEURONTIN) capsule 400 mg  400 mg Oral TID Sharma Covert, MD   400 mg at 08/08/17 0849  . loratadine (CLARITIN) tablet 10 mg  10 mg Oral Daily Ethelene Hal, NP   10 mg at 08/08/17 0849  . LORazepam (ATIVAN) tablet 0.5 mg  0.5 mg Oral Q6H PRN Cobos, Myer Peer, MD   0.5 mg at 08/04/17 2047  . magnesium hydroxide (MILK OF MAGNESIA) suspension 30 mL  30 mL Oral Daily PRN Ethelene Hal, NP      . menthol-cetylpyridinium (CEPACOL) lozenge 3 mg  1 lozenge Oral PRN Lindon Romp A, NP      . montelukast (SINGULAIR) chewable tablet 5 mg  5 mg Oral [address], Lowry Ram, [name]   5 mg at 08/08/17 1017  . nicotine polacrilex (NICORETTE) gum 2 mg  2 mg Oral PRN Cobos, Myer Peer, MD   2 mg at 08/04/17 1431  . pantoprazole (PROTONIX) EC tablet 40 mg  40 mg Oral Daily Ethelene Hal, NP   40 mg at 08/08/17 0850  . propranolol (INDERAL) tablet 20 mg  20 mg  Oral TID Sharma Covert, MD      . traZODone (DESYREL) tablet 50 mg  50 mg Oral QHS PRN Cobos, Myer Peer, MD   50 mg at 08/07/17 2137  . tuberculin injection 5 Units  5 Units Intradermal Once Sharma Covert, MD   5 Units at 08/07/17 1405  . ziprasidone (GEODON) capsule 80 mg  80 mg Oral BID WC Ethelene Hal, NP   80 mg at 08/08/17 5102    Lab Results:  Results for orders  placed or performed during the hospital encounter of 07/28/17 (from the past 48 hour(s))  Comprehensive metabolic panel     Status: Abnormal   Collection Time: 08/06/17  6:30 PM  Result Value Ref Range   Sodium 138 135 - 145 mmol/L   Potassium 4.4 3.5 - 5.1 mmol/L   Chloride 104 101 - 111 mmol/L   CO2 23 22 - 32 mmol/L   Glucose, Bld 110 (H) 65 - 99 mg/dL   BUN 14 6 - 20 mg/dL   Creatinine, Ser 1.31 (H) 0.61 - 1.24 mg/dL   Calcium 9.4 8.9 - 10.3 mg/dL   Total Protein 8.3 (H) 6.5 - 8.1 g/dL   Albumin 4.3 3.5 - 5.0 g/dL   AST 66 (H) 15 - 41 U/L   ALT 53 17 - 63 U/L   Alkaline Phosphatase 141 (H) 38 - 126 U/L   Total Bilirubin 0.3 0.3 - 1.2 mg/dL   GFR calc non Af Amer >60 >60 mL/min   GFR calc Af Amer >60 >60 mL/min    Comment: (NOTE) The eGFR has been calculated using the CKD EPI equation. This calculation has not been validated in all clinical situations. eGFR's persistently <60 mL/min signify possible Chronic Kidney Disease.    Anion gap 11 5 - 15    Comment: Performed at [facility], [place] [address] [place], [place] [address]    Blood Alcohol level:  Lab Results  Component Value Date   [facility] <10 07/27/2017   ETH <5 68/37/2902    Metabolic Disorder Labs: Lab Results  Component Value Date   HGBA1C 5.8 (H) 07/29/2017   MPG 119.76 07/29/2017   No results found for: PROLACTIN Lab Results  Component Value Date   CHOL 156 07/29/2017   TRIG 50 07/29/2017   HDL 62 07/29/2017   CHOLHDL 2.5 07/29/2017   VLDL 10 07/29/2017   LDLCALC 84 07/29/2017    Physical  Findings: AIMS: Facial and Oral Movements Muscles of Facial Expression: None, normal Lips and Perioral Area: None, normal Jaw: None, normal Tongue: None, normal,Extremity Movements Upper (arms, wrists, hands, fingers): None, normal Lower (legs, knees, ankles, toes): None, normal, Trunk Movements Neck, shoulders, hips: None, normal, Overall Severity Severity of abnormal movements (highest score from questions above): None, normal Incapacitation due to abnormal movements: None, normal Patient's awareness of abnormal movements (rate only patient's report): No Awareness, Dental Status Current problems with teeth and/or dentures?: No Does patient usually wear dentures?: No  CIWA:  CIWA-Ar Total: 1 COWS:  COWS Total Score: 2  Musculoskeletal: Strength & Muscle Tone: within normal limits Gait & Station: normal Patient leans: N/A  Psychiatric Specialty Exam: Physical Exam  Constitutional: He is oriented to person, place, and time. He appears well-developed and well-nourished.  HENT:  Head: Normocephalic and atraumatic.  Respiratory: Effort normal.  Musculoskeletal: Normal range of motion.  Neurological: He is alert and oriented to person, place, and time.    ROS  Blood pressure 139/78, pulse 95, temperature 98.1 F (36.7 C), temperature source Oral, resp. rate 16, height 5' 7"  (1.702 m), weight 67.1 kg (148 lb), SpO2 98 %.Body mass index is 23.18 kg/m.  General Appearance: Casual  Eye Contact:  Good  Speech:  Normal Rate  Volume:  Normal  Mood:  Euthymic  Affect:  Appropriate  Thought Process:  Coherent  Orientation:  Full (Time, Place, and Person)  Thought Content:  Logical  Suicidal Thoughts:  No  Homicidal Thoughts:  No  Memory:  Immediate;  Fair  Judgement:  Intact  Insight:  Fair  Psychomotor Activity:  Normal  Concentration:  Concentration: Fair  Recall:  [employer] of Knowledge:  Fair  Language:  Good  Akathisia:  No  Handed:  Right  AIMS (if indicated):      Assets:  Desire for Improvement Physical Health Resilience Social Support  ADL's:  Intact  Cognition:  WNL  Sleep:  Number of Hours: 6.75     Treatment Plan Summary: Daily contact with patient to assess and evaluate symptoms and progress in treatment, Medication management and Plan Patient seen and examined.  Patient is a 22 year old male with the above-stated past psychiatric history seen in follow-up.  He is having a pretty good day today.  He has not had any outbursts or irritability.  He does have a mild headache today.  Earlier this a.m. his blood pressure was still mildly elevated.  We will increase his propranolol to 20 mg p.o. 3 times daily.  This should help with anxiety as well as his blood pressure control.  I am going to leave the gabapentin, lorazepam, trazodone exactly the same as it has been.  We will continue to follow him.  Hopefully his meds are stable and we can move forward with his treatment.  Sharma Covert, MD 08/08/2017, 1:39 PM

## 2017-08-08 NOTE — Progress Notes (Addendum)
D Patient is observed OOB UAL on the 400 hall today..he tolerates this well. HE is observed ( laughing and socializing ,with his peers and frequently he is observed standing at the main nurses' station with 2 other young male 400 hall patients). His behavior is labile , he is easy to get agitated and responds frequently , as opposed to processing his feelings first.       A He completed his daily assessment and on this today he wrote he denied SI today and he rated his depression, hopelessness and anxiety " 5/5/5/", respectively. He says he is " learning about my feelings and what I need and I'm trying to learn how to not go off on people".        R Safety is in place.

## 2017-08-08 NOTE — [facility] Group Notes (Signed)
Identifying Needs   Date:  08/08/2017  Time:  6:19 PM  Type of Therapy:  Nurse Education  /  The group focuses on  Teaching patients how to identify their needs and then how to develop the skills needed to get those needs met.  Participation Level:  Active  Participation Quality:  Attentive  Affect:  Appropriate  Cognitive:  Alert  Insight:  Improving  Engagement in Group:  Engaged  Modes of Intervention:  Education  Summary of Progress/Problems:  Maurice [name] 08/08/2017, 6:19 PM

## 2017-08-09 MED ORDER — GABAPENTIN 400 MG PO CAPS
500.0000 mg | ORAL_CAPSULE | Freq: Three times a day (TID) | ORAL | Status: DC
  Administered 2017-08-09 – 2017-08-10 (×3): 500 mg via ORAL
  Filled 2017-08-09 (×5): qty 1

## 2017-08-09 MED ORDER — PROPRANOLOL HCL 40 MG PO TABS
40.0000 mg | ORAL_TABLET | Freq: Three times a day (TID) | ORAL | Status: DC
  Administered 2017-08-09 – 2017-08-13 (×12): 40 mg via ORAL
  Filled 2017-08-09 (×17): qty 1

## 2017-08-09 NOTE — [facility] Group Notes (Signed)
[facility] LCSW Group Therapy Note  Date/Time:08/09/17, 1000  Type of Therapy/Topic:  Group Therapy:  Feelings about Diagnosis  Participation Level:  Active   Mood: silly   Description of Group:    This group will allow patients to explore their thoughts and feelings about diagnoses they have received. Patients will be guided to explore their level of understanding and acceptance of these diagnoses. Facilitator will encourage patients to process their thoughts and feelings about the reactions of others to their diagnosis, and will guide patients in identifying ways to discuss their diagnosis with significant others in their lives. This group will be process-oriented, with patients participating in exploration of their own experiences as well as giving and receiving support and challenge from other group members.   Therapeutic Goals: 1. Patient will demonstrate understanding of diagnosis as evidence by identifying two or more symptoms of the disorder:  2. Patient will be able to express two feelings regarding the diagnosis 3. Patient will demonstrate ability to communicate their needs through discussion and/or role plays  Summary of Patient Progress:Pt came to group late but was engaged in the discussion.  Pt stated at the end of group that he is not in agreement with his diagnoses: depression, ODD.  Pt said he believes his only diagnosis should be ADHD.        Therapeutic Modalities:   Cognitive Behavioral Therapy Brief Therapy Feelings Identification   Daleen SquibbGreg Jw Covin, LCSW

## 2017-08-09 NOTE — Progress Notes (Signed)
Patient ID: Maurice [name], male   DOB: September 28, 1995, 22 y.o.   MRN: [id number] DAR Note: Pt continue to be childish and hyperactive on the unit. Pt required frequent redirecting. Pt observed interacting with peers on the unit. Pt complained of moderate anxiety; "I will be going to a new group home tomorrow; I am anxious and excited at the same time." Pt was med compliant. Denied SI/HI or AVH. All Pt's questions and concerns addressed. Support, encouragement, and safe environment provided.

## 2017-08-09 NOTE — Progress Notes (Signed)
[name]  08/09/2017 12:18 PM Maurice [name]  MRN:  [id number] Subjective: Patient is seen and examined.  Patient is [name] 22 year old male with [name] past psychiatric history significant for depression, aggressive behavior, oppositional defiant disorder, attention deficit disorder, intellectual disability, impulse control disorder as well as some suicidal ideation.  He is seen in follow-up.  He is having [name] good day today.  He has been playing bingo and interacting with others appropriately.  He continues to have his intermittent episodes where he gets very upset.  We discussed group home as well as potentially returning home to his mother's home today.  He is [name] little anxious about all of that.  His propranolol was increased to 20 mg p.o. 3 times daily yesterday.  Unfortunately his blood pressure still remains elevated, and he still [name] bit tachycardic.  He denied any gross suicidal ideation today. Principal Problem: MDD (major depressive disorder), recurrent severe, without psychosis (HCC) Diagnosis:   Patient Active Problem List   Diagnosis Date Noted  . MDD (major depressive disorder), recurrent severe, without psychosis (HCC) [F33.2] 07/28/2017  . Aggressive behavior [R46.89] 11/30/2015  . Hypertension [I10]   . Intentional overdose of drug in tablet form (HCC) [T50.902A]   . Other secondary hypertension [I15.8]   . Somnolence [R40.0]   . Overdose [T50.901A] 11/09/2015  . GERD (gastroesophageal reflux disease) [K21.9] 07/20/2015  . Appetite lost [R63.0] 07/20/2015  . Suicidal ideation [R45.851]   . DMDD (disruptive mood dysregulation disorder) (HCC) [F34.81] 03/13/2015  . Homicidal ideations [R45.850] 03/13/2015  . Intellectual disability [F79] 02/26/2015  . Attention-deficit hyperactivity disorder, predominantly hyperactive type [F90.1]   . ADHD (attention deficit hyperactivity disorder), combined type [F90.2] 01/31/2015  . Oppositional defiant disorder [F91.3] 01/31/2015  . Mood  disorder (HCC) [F39] 01/31/2015   Total Time spent with patient: 20 minutes  Past Psychiatric History: See admission H&P  Past Medical History:  Past Medical History:  Diagnosis Date  . ADHD (attention deficit hyperactivity disorder)   . Allergy   . Appetite lost 07/20/2015  . Asthma   . GERD (gastroesophageal reflux disease) 07/20/2015  . Homicidal ideations   . Intellectual disability 02/26/2015  . ODD (oppositional defiant disorder)   . Suicidal ideations    History reviewed. No pertinent surgical history. Family History:  Family History  Adopted: Yes   Family Psychiatric  History: See admission H&P Social History:  Social History   Substance and Sexual Activity  Alcohol Use Yes   Comment: I mix henessy with gatorade     Social History   Substance and Sexual Activity  Drug Use No    Social History   Socioeconomic History  . Marital status: Single    Spouse name: Not on file  . Number of children: Not on file  . Years of education: Not on file  . Highest education level: Not on file  Occupational History  . Not on file  Social Needs  . Financial resource strain: Not on file  . Food insecurity:    Worry: Not on file    Inability: Not on file  . Transportation needs:    Medical: Not on file    Non-medical: Not on file  Tobacco Use  . Smoking status: Never Smoker  . Smokeless tobacco: Never Used  Substance and Sexual Activity  . Alcohol use: Yes    Comment: I mix henessy with gatorade  . Drug use: No  . Sexual activity: Yes    Birth control/protection: Condom  Lifestyle  . Physical activity:    Days per week: Not on file    Minutes per session: Not on file  . Stress: Not on file  Relationships  . Social connections:    Talks on phone: Not on file    Gets together: Not on file    Attends religious service: Not on file    Active member of club or organization: Not on file    Attends meetings of clubs or organizations: Not on file    Relationship  status: Not on file  Other Topics Concern  . Not on file  Social History Narrative  . Not on file   Additional Social History:                         Sleep: Fair  Appetite:  Good  Current Medications: Current Facility-Administered Medications  Medication Dose Route Frequency Provider Last Rate Last Dose  . acetaminophen (TYLENOL) tablet 650 mg  650 mg Oral Q6H PRN Maurice Abbe, NP   650 mg at 08/08/17 2136  . albuterol (PROVENTIL HFA;VENTOLIN HFA) 108 (90 Base) MCG/ACT inhaler 2 puff  2 puff Inhalation Q6H PRN Maurice Abbe, NP      . alum & mag hydroxide-simeth (Maurice [name]/MYLANTA) 200-200-20 MG/5ML suspension 30 mL  30 mL Oral Q4H PRN Maurice Abbe, NP      . budesonide (PULMICORT) nebulizer solution 0.5 mg  0.5 mg Nebulization BID Maurice Abbe, NP   0.5 mg at 08/08/17 0849  . cloNIDine (CATAPRES) tablet 0.05 mg  0.05 mg Oral BID PRN Maurice Pert, MD      . fluticasone [facility]) 50 MCG/ACT nasal spray 1 spray  1 spray Each Nare Daily PRN Maurice Abbe, NP      . gabapentin (NEURONTIN) capsule 400 mg  400 mg Oral TID Maurice Pert, MD   400 mg at 08/09/17 0849  . loratadine (CLARITIN) tablet 10 mg  10 mg Oral Daily Maurice Abbe, NP   10 mg at 08/09/17 0849  . LORazepam (ATIVAN) tablet 0.5 mg  0.5 mg Oral Q6H PRN Maurice [name], Maurice Situ, MD   0.5 mg at 08/08/17 2136  . magnesium hydroxide (MILK OF MAGNESIA) suspension 30 mL  30 mL Oral Daily PRN Maurice Abbe, NP      . menthol-cetylpyridinium (CEPACOL) lozenge 3 mg  1 lozenge Oral PRN Maurice Conn A, NP      . montelukast (SINGULAIR) chewable tablet 5 mg  5 mg Oral [address], Maurice Burdock, Maurice [name]   5 mg at 08/09/17 1610  . nicotine polacrilex (NICORETTE) gum 2 mg  2 mg Oral PRN Maurice [name], Maurice Situ, MD   2 mg at 08/04/17 1431  . pantoprazole (PROTONIX) EC tablet 40 mg  40 mg Oral Daily Maurice Abbe, NP   40 mg at 08/09/17 0849  . propranolol (INDERAL) tablet 20 mg   20 mg Oral TID Maurice Pert, MD   20 mg at 08/09/17 0849  . traZODone (DESYREL) tablet 50 mg  50 mg Oral QHS PRN Maurice [name], Maurice Situ, MD   50 mg at 08/08/17 2136  . tuberculin injection 5 Units  5 Units Intradermal Once Maurice Pert, MD   5 Units at 08/07/17 1405  . ziprasidone (GEODON) capsule 80 mg  80 mg Oral BID WC Maurice Abbe, NP   80 mg at 08/09/17 9604    Lab Results: No results found for this  or any previous visit (from the past 48 hour(s)).  Blood Alcohol level:  Lab Results  Component Value Date   ETH <10 07/27/2017   ETH <5 08/03/2016    Metabolic Disorder Labs: Lab Results  Component Value Date   HGBA1C 5.8 (H) 07/29/2017   MPG 119.76 07/29/2017   No results found for: PROLACTIN Lab Results  Component Value Date   CHOL 156 07/29/2017   TRIG 50 07/29/2017   HDL 62 07/29/2017   CHOLHDL 2.5 07/29/2017   VLDL 10 07/29/2017   LDLCALC 84 07/29/2017    Physical Findings: AIMS: Facial and Oral Movements Muscles of Facial Expression: None, normal Lips and Perioral Area: None, normal Jaw: None, normal Tongue: None, normal,Extremity Movements Upper (arms, wrists, hands, fingers): None, normal Lower (legs, knees, ankles, toes): None, normal, Trunk Movements Neck, shoulders, hips: None, normal, Overall Severity Severity of abnormal movements (highest score from questions above): None, normal Incapacitation due to abnormal movements: None, normal Patient's awareness of abnormal movements (rate only patient's report): No Awareness, Dental Status Current problems with teeth and/or dentures?: No Does patient usually wear dentures?: No  CIWA:  CIWA-Ar Total: 1 COWS:  COWS Total Score: 2  Musculoskeletal: Strength & Muscle Tone: within normal limits Gait & Station: normal Patient leans: N/[name]  Psychiatric Specialty Exam: Physical Exam  Nursing note and vitals reviewed. Constitutional: He is oriented to person, place, and time. He appears  well-developed and well-nourished.  HENT:  Head: Normocephalic and atraumatic.  Respiratory: Effort normal.  Musculoskeletal: Normal range of motion.  Neurological: He is alert and oriented to person, place, and time.    ROS  Blood pressure (!) 148/83, pulse (!) 108, temperature 98.4 F (36.9 C), temperature source Oral, resp. rate 16, height 5\' 7"  (1.702 m), weight 67.1 kg (148 lb), SpO2 98 %.Body mass index is 23.18 kg/m.  General Appearance: Fairly Groomed  Eye Contact:  Fair  Speech:  Normal Rate  Volume:  Normal  Mood:  Anxious  Affect:  Appropriate  Thought Process:  Coherent  Orientation:  Full (Time, Place, and Person)  Thought Content:  Logical  Suicidal Thoughts:  No  Homicidal Thoughts:  No  Memory:  Immediate;   Fair  Judgement:  Intact  Insight:  Fair  Psychomotor Activity:  Increased  Concentration:  Concentration: Fair  Recall:  [employer] of Knowledge:  Fair  Language:  Good  Akathisia:  No  Handed:  Right  AIMS (if indicated):     Assets:  Desire for Improvement Physical Health Resilience  ADL's:  Intact  Cognition:  WNL  Sleep:  Number of Hours: 6.75     Treatment Plan Summary: Daily contact with patient to assess and evaluate symptoms and progress in treatment, Medication management and Plan Patient is seen and examined.  Patient is [name] 22 year old male with the above-stated past psychiatric history seen in follow-up.  He continues on Geodon, trazodone, propranolol, lorazepam, gabapentin for psychiatric reasons.  He is [name] little bit calmer today.  He is anxious about what will happen with him in the long run.  Hopefully social work will be able to get more information about [name] timeframe for placement at the group home.  We also discussed the possibility of returning home with his mother.  He denied any suicidal ideation.  He stated he slept well last night.  He is not having any irritability today, but he has had it on [name] pretty frequent basis.  His blood  pressure remains elevated, and  Emina increase his Inderal again today.  I may switch him to long-acting Inderal if the increase today is effective.  I am also going to go on increase his gabapentin to 600 mg p.o. 3 times daily.  Hopefully he will tolerate that.  Hopefully it will help his anxiety as well, and if his blood pressure is elevated because of anxiety will help there as well.  Maurice Pert, MD 08/09/2017, 12:18 PM

## 2017-08-09 NOTE — [facility] Group Notes (Addendum)
Identifying Needs   Date:  08/09/2017  Time:  2:20 PM  Type of Therapy:  The group focuses on teaching pts how to identify their needs /---and how to develop the skills needed to get thiose needs met.   Participation Level:  Pt dfid not attend.  Participation Quality:    Affect:    Cognitive:    Insight:    Engagement in Group:    Modes of Intervention:    Summary of Progress/Problems:  Lauralyn Primes 08/09/2017, 2:20 PM

## 2017-08-10 MED ORDER — GABAPENTIN 300 MG PO CAPS
600.0000 mg | ORAL_CAPSULE | Freq: Three times a day (TID) | ORAL | Status: DC
  Administered 2017-08-10 – 2017-08-13 (×9): 600 mg via ORAL
  Filled 2017-08-10 (×14): qty 2

## 2017-08-10 MED ORDER — FLUTICASONE PROPIONATE 50 MCG/ACT NA SUSP
2.0000 | Freq: Every day | NASAL | Status: DC
  Administered 2017-08-10 – 2017-08-13 (×4): 2 via NASAL
  Filled 2017-08-10: qty 16

## 2017-08-10 NOTE — Plan of Care (Signed)
  Problem: Coping: Goal: Ability to verbalize frustrations and anger appropriately will improve Outcome: Progressing   Problem: Self-Concept: Goal: Will verbalize positive feelings about self Outcome: Progressing   

## 2017-08-10 NOTE — Progress Notes (Signed)
Recreation Therapy Notes  Date: 4.15.19 Time: 9:30 a.m. Location: [address] Dayroom   Group Topic: Stress Management   Goal Area(s) Addresses:  Goal 1.1: To reduce stress  -Patient will feel a reduction in stress level  -Patient will understand the importance of stress management  -Patient will participate during stress management group      Intervention: Stress Management  Activity: Guided Imagery: Patients were in a peaceful environment with soft lighting enhancing patients mood. Patients listened to a guided imagery script read by Recreation Therapy Intern.  Education: Stress Management, Discharge Planning.    Education Outcome: Acknowledges edcuation/In group clarification offered/Needs additional education   Clinical Observations/Feedback:: Patient did not attend     Hermen Mario, Recreation Therapy Intern   Hugh Garrow 08/10/2017 9:13 AM 

## 2017-08-10 NOTE — Progress Notes (Signed)
Pt presents with a flat affect and anxious mood. Pt noted to be irritable on and off this morning/afternoon. Pt reported poor sleep last night due to feeling upset at bedtime because of something that happened but would not elaborate. Pt rates anxiety 5/10. Depression 5/10. Pt denies SI/HI. Pt noted to be intrusive at all times throughout the day. Pt encouraged to be respectful of other pt's time with staff. Orders reviewed with pt. Verbal support provided. Pt encouraged to attend groups. 15 minute checks performed for safety. Pt compliant with safety

## 2017-08-10 NOTE — Progress Notes (Signed)
[facility] MD Progress Note  08/10/2017 4:07 PM Maurice [name]  MRN:  [id number] Subjective: Patient is seen and examined.  Patient is a 22 year old male with a past psychiatric history significant for depression, aggressive behavior, disruptive mood dysregulation disorder as a child, attention deficit disorder, oppositional defiant disorder, intellectual disability and probable impulse control disorder.  He is seen in follow-up.  He has had a pretty good 24 hours.  He did have some issues last night, but was at least able to be redirected.  He still is intrusive.  Social work contacted the authorities again, and Medicaid has not approved the group home setting as of yet.  My hope is that that will be done sometime in the next couple of days, but it sounds like it may take weeks.  We discussed disposition, and it may be that when he is ready to go home he may have to return to his mother's.  Otherwise he denies any suicidal ideation or psychotic thoughts at this time.  He is having significant issues with his allergies. Principal Problem: MDD (major depressive disorder), recurrent severe, without psychosis (HCC) Diagnosis:   Patient Active Problem List   Diagnosis Date Noted  . MDD (major depressive disorder), recurrent severe, without psychosis (HCC) [F33.2] 07/28/2017  . Aggressive behavior [R46.89] 11/30/2015  . Hypertension [I10]   . Intentional overdose of drug in tablet form (HCC) [T50.902A]   . Other secondary hypertension [I15.8]   . Somnolence [R40.0]   . Overdose [T50.901A] 11/09/2015  . GERD (gastroesophageal reflux disease) [K21.9] 07/20/2015  . Appetite lost [R63.0] 07/20/2015  . Suicidal ideation [R45.851]   . DMDD (disruptive mood dysregulation disorder) (HCC) [F34.81] 03/13/2015  . Homicidal ideations [R45.850] 03/13/2015  . Intellectual disability [F79] 02/26/2015  . Attention-deficit hyperactivity disorder, predominantly hyperactive type [F90.1]   . ADHD (attention deficit  hyperactivity disorder), combined type [F90.2] 01/31/2015  . Oppositional defiant disorder [F91.3] 01/31/2015  . Mood disorder (HCC) [F39] 01/31/2015   Total Time spent with patient: 20 minutes  Past Psychiatric History: As per admission H&P  Past Medical History:  Past Medical History:  Diagnosis Date  . ADHD (attention deficit hyperactivity disorder)   . Allergy   . Appetite lost 07/20/2015  . Asthma   . GERD (gastroesophageal reflux disease) 07/20/2015  . Homicidal ideations   . Intellectual disability 02/26/2015  . ODD (oppositional defiant disorder)   . Suicidal ideations    History reviewed. No pertinent surgical history. Family History:  Family History  Adopted: Yes   Family Psychiatric  History: As per admission H&P Social History:  Social History   Substance and Sexual Activity  Alcohol Use Yes   Comment: I mix henessy with gatorade     Social History   Substance and Sexual Activity  Drug Use No    Social History   Socioeconomic History  . Marital status: Single    Spouse name: Not on file  . Number of children: Not on file  . Years of education: Not on file  . Highest education level: Not on file  Occupational History  . Not on file  Social Needs  . Financial resource strain: Not on file  . Food insecurity:    Worry: Not on file    Inability: Not on file  . Transportation needs:    Medical: Not on file    Non-medical: Not on file  Tobacco Use  . Smoking status: Never Smoker  . Smokeless tobacco: Never Used  Substance and Sexual Activity  .  Alcohol use: Yes    Comment: I mix henessy with gatorade  . Drug use: No  . Sexual activity: Yes    Birth control/protection: Condom  Lifestyle  . Physical activity:    Days per week: Not on file    Minutes per session: Not on file  . Stress: Not on file  Relationships  . Social connections:    Talks on phone: Not on file    Gets together: Not on file    Attends religious service: Not on file     Active member of club or organization: Not on file    Attends meetings of clubs or organizations: Not on file    Relationship status: Not on file  Other Topics Concern  . Not on file  Social History Narrative  . Not on file   Additional Social History:                         Sleep: Fair  Appetite:  Good  Current Medications: Current Facility-Administered Medications  Medication Dose Route Frequency Provider Last Rate Last Dose  . acetaminophen (TYLENOL) tablet 650 mg  650 mg Oral Q6H PRN Laveda AbbeParks, Laurie Britton, NP   650 mg at 08/08/17 2136  . albuterol (PROVENTIL HFA;VENTOLIN HFA) 108 (90 Base) MCG/ACT inhaler 2 puff  2 puff Inhalation Q6H PRN Laveda AbbeParks, Laurie Britton, NP      . alum & mag hydroxide-simeth (MAALOX/MYLANTA) 200-200-20 MG/5ML suspension 30 mL  30 mL Oral Q4H PRN Laveda AbbeParks, Laurie Britton, NP      . budesonide (PULMICORT) nebulizer solution 0.5 mg  0.5 mg Nebulization BID Laveda AbbeParks, Laurie Britton, NP   0.5 mg at 08/08/17 0849  . cloNIDine (CATAPRES) tablet 0.05 mg  0.05 mg Oral BID PRN Antonieta Pertlary, Greg Lawson, MD      . fluticasone [facility](FLONASE) 50 MCG/ACT nasal spray 2 spray  2 spray Each Nare Daily Antonieta Pertlary, Greg Lawson, MD      . gabapentin (NEURONTIN) capsule 600 mg  600 mg Oral TID Antonieta Pertlary, Greg Lawson, MD      . loratadine (CLARITIN) tablet 10 mg  10 mg Oral Daily Laveda AbbeParks, Laurie Britton, NP   10 mg at 08/10/17 0750  . LORazepam (ATIVAN) tablet 0.5 mg  0.5 mg Oral Q6H PRN Cobos, Rockey SituFernando A, MD   0.5 mg at 08/09/17 2117  . magnesium hydroxide (MILK OF MAGNESIA) suspension 30 mL  30 mL Oral Daily PRN Laveda AbbeParks, Laurie Britton, NP      . menthol-cetylpyridinium (CEPACOL) lozenge 3 mg  1 lozenge Oral PRN Nira ConnBerry, Jason A, NP      . montelukast (SINGULAIR) chewable tablet 5 mg  5 mg Oral [address] Money, Gerlene Burdockravis B, [name]   5 mg at 08/10/17 0750  . nicotine polacrilex (NICORETTE) gum 2 mg  2 mg Oral PRN Cobos, Rockey SituFernando A, MD   2 mg at 08/04/17 1431  . pantoprazole (PROTONIX) EC tablet 40 mg  40 mg  Oral Daily Laveda AbbeParks, Laurie Britton, NP   40 mg at 08/10/17 0750  . propranolol (INDERAL) tablet 40 mg  40 mg Oral TID Antonieta Pertlary, Greg Lawson, MD   40 mg at 08/10/17 1213  . traZODone (DESYREL) tablet 50 mg  50 mg Oral QHS PRN Cobos, Rockey SituFernando A, MD   50 mg at 08/09/17 2117  . ziprasidone (GEODON) capsule 80 mg  80 mg Oral BID WC Laveda AbbeParks, Laurie Britton, NP   80 mg at 08/10/17 0750    Lab Results: No results  found for this or any previous visit (from the past 48 hour(s)).  Blood Alcohol level:  Lab Results  Component Value Date   ETH <10 07/27/2017   ETH <5 08/03/2016    Metabolic Disorder Labs: Lab Results  Component Value Date   HGBA1C 5.8 (H) 07/29/2017   MPG 119.76 07/29/2017   No results found for: PROLACTIN Lab Results  Component Value Date   CHOL 156 07/29/2017   TRIG 50 07/29/2017   HDL 62 07/29/2017   CHOLHDL 2.5 07/29/2017   VLDL 10 07/29/2017   LDLCALC 84 07/29/2017    Physical Findings: AIMS: Facial and Oral Movements Muscles of Facial Expression: None, normal Lips and Perioral Area: None, normal Jaw: None, normal Tongue: None, normal,Extremity Movements Upper (arms, wrists, hands, fingers): None, normal Lower (legs, knees, ankles, toes): None, normal, Trunk Movements Neck, shoulders, hips: None, normal, Overall Severity Severity of abnormal movements (highest score from questions above): None, normal Incapacitation due to abnormal movements: None, normal Patient's awareness of abnormal movements (rate only patient's report): No Awareness, Dental Status Current problems with teeth and/or dentures?: No Does patient usually wear dentures?: No  CIWA:  CIWA-Ar Total: 1 COWS:  COWS Total Score: 2  Musculoskeletal: Strength & Muscle Tone: within normal limits Gait & Station: normal Patient leans: N/A  Psychiatric Specialty Exam: Physical Exam  Constitutional: He is oriented to person, place, and time. He appears well-developed and well-nourished.  HENT:  Head:  Normocephalic and atraumatic.  Mouth/Throat: Oropharyngeal exudate present.  Respiratory: Effort normal.  Musculoskeletal: Normal range of motion.  Neurological: He is alert and oriented to person, place, and time.    ROS  Blood pressure 133/66, pulse 74, temperature 98 F (36.7 C), temperature source Oral, resp. rate 16, height 5\' 7"  (1.702 m), weight 67.1 kg (148 lb), SpO2 98 %.Body mass index is 23.18 kg/m.  General Appearance: Casual  Eye Contact:  Fair  Speech:  Normal Rate  Volume:  Normal  Mood:  Anxious  Affect:  Appropriate  Thought Process:  Coherent  Orientation:  Full (Time, Place, and Person)  Thought Content:  Logical  Suicidal Thoughts:  No  Homicidal Thoughts:  No  Memory:  Immediate;   Fair  Judgement:  Impaired  Insight:  Fair  Psychomotor Activity:  Increased  Concentration:  Concentration: Fair  Recall:  [employer] of Knowledge:  Fair  Language:  Good  Akathisia:  No  Handed:  Right  AIMS (if indicated):     Assets:  Desire for Improvement Physical Health Resilience  ADL's:  Intact  Cognition:  WNL  Sleep:  Number of Hours: 6.75     Treatment Plan Summary: Daily contact with patient to assess and evaluate symptoms and progress in treatment, Medication management and Plan Patient is seen and examined.  Patient is a 22 year old male with the above-stated past psychiatric history seen for follow-up.  He has had a fairly good last 24 hours.  His impulse control is been better.  I did go on and increase his Neurontin today from 500 3 times daily to 600 mg 3 times daily.  I considered increasing his Geodon but he is already on 80 mg twice daily.  Will see if this helps his impulse control.  Otherwise we continue to work on the social issues with group home placement.  This is an ongoing process.  Additionally from a medical standpoint he is having a great deal of sinus problem because of allergies.  He is already on loratadine,  but he is on Flonase but only as  needed.  I am going to make it 2 sprays in each nostril daily standing at least while the pollen is as bad as it is..  Additionally will continue to monitor his blood pressure.  No other changes in his medications at this point.  Antonieta Pert, MD 08/10/2017, 4:07 PM

## 2017-08-10 NOTE — [facility] Group Notes (Signed)
Adult Psychoeducational Group Note  Date:  08/10/2017 Time:  4:55 PM  Group Topic/Focus:  Wellness Toolbox:   The focus of this group is to discuss various aspects of wellness, balancing those aspects and exploring ways to increase the ability to experience wellness.  Patients will create a wellness toolbox for use upon discharge.  Participation Level:  Active  Participation Quality:  Appropriate  Affect:  Appropriate  Cognitive:  Alert  Insight: Appropriate  Engagement in Group:  Engaged  Modes of Intervention:  Discussion  Additional Comments:  Pt attended and participated in psycho-educational group.  Dellia NimsJaquesha M Robert Sperl 08/10/2017, 4:55 PM

## 2017-08-10 NOTE — Progress Notes (Signed)
Pt attend wrap. His day was a 4. His goal was to call mom. She talk to her mother more than once a day. He really want to talk to his mother daily.

## 2017-08-11 NOTE — Progress Notes (Signed)
Patient ID: Maurice [name], male   DOB: Dec 03, 1995, 22 y.o.   MRN: [id number] DAR Note: Pt observed resting in room with eyes closed. Pt complained of moderate depression; "I thought I was going to my new place today but I didn't." Pt denied anxiety, SI/HI or AVH. All Pt's questions and concerns addressed. Support, encouragement, and safe environment provided.

## 2017-08-11 NOTE — Progress Notes (Signed)
Recreation Therapy Notes  Animal-Assisted Activity (AAA) Program Checklist/Progress Notes Patient Eligibility Criteria Checklist & Daily Group note for Rec Tx Intervention  Date: 4.16.19 Time: 2:45 p.m. [address]   AAA/T Program Assumption of Risk Form signed by Patient/ or Parent Legal Guardian Yes  Patient is free of allergies or sever asthma Yes  Patient reports no fear of animals Yes  Patient reports no history of cruelty to animals Yes  Patient understands his/her participation is voluntary Yes  Patient washes hands before animal contact Yes  Patient washes hands after animal contact Yes  Behavioral Response: Appropriate   Education: Hand Washing, Appropriate Animal Interaction   Education Outcome: Acknowledges Education.   Clinical Observations/Feedback: Patient attended session and interacted appropriately with therapy dog and peers   Sheryle HailDarian Thalya Fouche, Recreation Therapy Intern   Sheryle HailDarian Chayse Zatarain 08/11/2017 2:42 PM

## 2017-08-11 NOTE — Progress Notes (Addendum)
[facility] MD Progress Note  08/11/2017 3:02 PM Maurice [name]  MRN:  [id number] Subjective: Patient reports feeling "all right".  Currently denies medication side effects.  Denies suicidal ideations.  He focuses on disposition planning issues, states he is hoping to be accepted into a group home "maybe tomorrow". Objective: I have discussed case with treatment team and have met with patient.  At this time presents calm, pleasant on approach. As reviewed with Dr. Karmen Stabs sign out information  and with staff  patient has improved partially but continues to present with some intrusiveness and lability.  He has reportedly been accepted to group home but report is that this process may take several more days/week. Patient is reluctant to return home in the meantime, but I feel he  seems less significantly concerned about this when he had been initially.  Currently denies medication side effects. Denies suicidal ideations.   principal Problem: MDD (major depressive disorder), recurrent severe, without psychosis ([place]) Diagnosis:   Patient Active Problem List   Diagnosis Date Noted  . MDD (major depressive disorder), recurrent severe, without psychosis ([place]) [F33.2] 07/28/2017  . Aggressive behavior [R46.89] 11/30/2015  . Hypertension [I10]   . Intentional overdose of drug in tablet form ([place]) [T50.902A]   . Other secondary hypertension [I15.8]   . Somnolence [R40.0]   . Overdose [T50.901A] 11/09/2015  . GERD (gastroesophageal reflux disease) [K21.9] 07/20/2015  . Appetite lost [R63.0] 07/20/2015  . Suicidal ideation [R45.851]   . DMDD (disruptive mood dysregulation disorder) ([place]) [F34.81] 03/13/2015  . Homicidal ideations [R45.850] 03/13/2015  . Intellectual disability [F79] 02/26/2015  . Attention-deficit hyperactivity disorder, predominantly hyperactive type [F90.1]   . ADHD (attention deficit hyperactivity disorder), combined type [F90.2] 01/31/2015  . Oppositional defiant disorder [F91.3]  01/31/2015  . Mood disorder ([place]) [F39] 01/31/2015   Total Time spent with patient: 20 minutes  Past Psychiatric History: See admission H&P  Past Medical History:  Past Medical History:  Diagnosis Date  . ADHD (attention deficit hyperactivity disorder)   . Allergy   . Appetite lost 07/20/2015  . Asthma   . GERD (gastroesophageal reflux disease) 07/20/2015  . Homicidal ideations   . Intellectual disability 02/26/2015  . ODD (oppositional defiant disorder)   . Suicidal ideations    History reviewed. No pertinent surgical history. Family History:  Family History  Adopted: Yes   Family Psychiatric  History: See admission H&P Social History:  Social History   Substance and Sexual Activity  Alcohol Use Yes   Comment: I mix henessy with gatorade     Social History   Substance and Sexual Activity  Drug Use No    Social History   Socioeconomic History  . Marital status: Single    Spouse name: Not on file  . Number of children: Not on file  . Years of education: Not on file  . Highest education level: Not on file  Occupational History  . Not on file  Social Needs  . Financial resource strain: Not on file  . Food insecurity:    Worry: Not on file    Inability: Not on file  . Transportation needs:    Medical: Not on file    Non-medical: Not on file  Tobacco Use  . Smoking status: Never Smoker  . Smokeless tobacco: Never Used  Substance and Sexual Activity  . Alcohol use: Yes    Comment: I mix henessy with gatorade  . Drug use: No  . Sexual activity: Yes    Birth control/protection:  Condom  Lifestyle  . Physical activity:    Days per week: Not on file    Minutes per session: Not on file  . Stress: Not on file  Relationships  . Social connections:    Talks on phone: Not on file    Gets together: Not on file    Attends religious service: Not on file    Active member of club or organization: Not on file    Attends meetings of clubs or organizations: Not on  file    Relationship status: Not on file  Other Topics Concern  . Not on file  Social History Narrative  . Not on file   Additional Social History:   Sleep: Good  Appetite:  Good  Current Medications: Current Facility-Administered Medications  Medication Dose Route Frequency Provider Last Rate Last Dose  . acetaminophen (TYLENOL) tablet 650 mg  650 mg Oral Q6H PRN Ethelene Hal, NP   650 mg at 08/08/17 2136  . albuterol (PROVENTIL HFA;VENTOLIN HFA) 108 (90 Base) MCG/ACT inhaler 2 puff  2 puff Inhalation Q6H PRN Ethelene Hal, NP      . alum & mag hydroxide-simeth (MAALOX/MYLANTA) 200-200-20 MG/5ML suspension 30 mL  30 mL Oral Q4H PRN Ethelene Hal, NP      . budesonide (PULMICORT) nebulizer solution 0.5 mg  0.5 mg Nebulization BID Ethelene Hal, NP   0.5 mg at 08/08/17 0849  . cloNIDine (CATAPRES) tablet 0.05 mg  0.05 mg Oral BID PRN Sharma Covert, MD      . fluticasone [facility]) 50 MCG/ACT nasal spray 2 spray  2 spray Each Nare Daily Sharma Covert, MD   2 spray at 08/11/17 0751  . gabapentin (NEURONTIN) capsule 600 mg  600 mg Oral TID Sharma Covert, MD   600 mg at 08/11/17 1217  . loratadine (CLARITIN) tablet 10 mg  10 mg Oral Daily Ethelene Hal, NP   10 mg at 08/11/17 0747  . LORazepam (ATIVAN) tablet 0.5 mg  0.5 mg Oral Q6H PRN Travares Nelles, Myer Peer, MD   0.5 mg at 08/10/17 2252  . magnesium hydroxide (MILK OF MAGNESIA) suspension 30 mL  30 mL Oral Daily PRN Ethelene Hal, NP      . menthol-cetylpyridinium (CEPACOL) lozenge 3 mg  1 lozenge Oral PRN Lindon Romp A, NP      . montelukast (SINGULAIR) chewable tablet 5 mg  5 mg Oral [address], [place] B, [name]   5 mg at 08/11/17 0747  . nicotine polacrilex (NICORETTE) gum 2 mg  2 mg Oral PRN Tiger Spieker, Myer Peer, MD   2 mg at 08/04/17 1431  . pantoprazole (PROTONIX) EC tablet 40 mg  40 mg Oral Daily Ethelene Hal, NP   40 mg at 08/11/17 0747  . propranolol (INDERAL) tablet  40 mg  40 mg Oral TID Sharma Covert, MD   40 mg at 08/11/17 1203  . traZODone (DESYREL) tablet 50 mg  50 mg Oral QHS PRN Karlis Cregg, Myer Peer, MD   50 mg at 08/10/17 2252  . ziprasidone (GEODON) capsule 80 mg  80 mg Oral BID WC Ethelene Hal, NP   80 mg at 08/11/17 7939    Lab Results: No results found for this or any previous visit (from the past 48 hour(s)).  Blood Alcohol level:  Lab Results  Component Value Date   [facility] <10 07/27/2017   ETH <5 03/00/9233    Metabolic Disorder Labs: Lab Results  Component  Value Date   HGBA1C 5.8 (H) 07/29/2017   MPG 119.76 07/29/2017   No results found for: PROLACTIN Lab Results  Component Value Date   CHOL 156 07/29/2017   TRIG 50 07/29/2017   HDL 62 07/29/2017   CHOLHDL 2.5 07/29/2017   VLDL 10 07/29/2017   LDLCALC 84 07/29/2017    Physical Findings: AIMS: Facial and Oral Movements Muscles of Facial Expression: None, normal Lips and Perioral Area: None, normal Jaw: None, normal Tongue: None, normal,Extremity Movements Upper (arms, wrists, hands, fingers): None, normal Lower (legs, knees, ankles, toes): None, normal, Trunk Movements Neck, shoulders, hips: None, normal, Overall Severity Severity of abnormal movements (highest score from questions above): None, normal Incapacitation due to abnormal movements: None, normal Patient's awareness of abnormal movements (rate only patient's report): No Awareness, Dental Status Current problems with teeth and/or dentures?: No Does patient usually wear dentures?: No  CIWA:  CIWA-Ar Total: 1 COWS:  COWS Total Score: 2  Musculoskeletal: Strength & Muscle Tone: within normal limits Gait & Station: normal Patient leans: N/A  Psychiatric Specialty Exam: Physical Exam  Nursing note and vitals reviewed. Constitutional: He is oriented to person, place, and time. He appears well-developed and well-nourished.  HENT:  Head: Normocephalic and atraumatic.  Respiratory: Effort normal.   Musculoskeletal: Normal range of motion.  Neurological: He is alert and oriented to person, place, and time.    ROS denies chest pain, no shortness of breath, no vomiting  Blood pressure (!) 157/81, pulse 66, temperature 98 F (36.7 C), temperature source Oral, resp. rate 16, height _0  (1.702 m), weight 67.1 kg (148 lb), SpO2 98 %.Body mass index is 23.18 kg/m.  General Appearance: Well Groomed  Eye Contact:  Good  Speech:  Normal Rate  Volume:  Normal  Mood:  Improving but describes some depression  Affect:  Appropriate and Reactive  Thought Process:  Linear and Descriptions of Associations: Intact  Orientation:  Other:  Fully alert and attentive  Thought Content:  No hallucinations, no delusions  Suicidal Thoughts:  No denies current suicidal or self-injurious plans or intentions/contracts for safety on unit  Homicidal Thoughts:  No  Memory:  recent and remote grossly intact   Judgement:  Other:  Improving  Insight:  Fair-improved  Psychomotor Activity:  Normal  Concentration:  Concentration: Good and Attention Span: Good  Recall:  Good  Fund of Knowledge:  Good  Language:  Good  Akathisia:  No  Handed:  Right  AIMS (if indicated):     Assets:  Desire for Improvement Physical Health Resilience  ADL's:  Intact  Cognition:  WNL  Sleep:  Number of Hours: 6   Assessment -patient presents with gradual improvement compared to his admission presentation.  Although still exhibiting some intrusive behaviors at times is redirectable and there are no overt disruptive behaviors at this time.  Currently tolerating medications well.  The focus at this time is on discharge planning.  He wants to go to a group home and report from staff is that he has been accepted to one but that process may take several days.  Patient had been living with mother prior to admission, at this time reports some reluctance to return there (while he awaits group home) but generally seems less concerned and  focused on these concerns and denies any HI or violent ideations towards mother at this time.  Treatment Plan Summary: Treatment plan reviewed as below today April 16 Encourage group and milieu participation to work on Radiographer, therapeutic and  symptom reduction Treatment team working on disposition plan as above Continue Geodon 80 mg twice daily for mood disorder Continue Neurontin 600 mg 3 times daily for anxiety Continue Trazodone 50 mg nightly as needed for insomnia Continue Ativan 0.5 mg every 6 hours for anxiety as needed Continue Inderal 40 mg 3 times daily for impulsivity and BP Continue Clonidine 0.05 mg twice daily PRN for agitation if needed   Jenne Campus, MD 08/11/2017, 3:02 PM  Patient ID: Maurice [name], male   DOB: 1995-12-29, 22 y.o.   MRN: [id number]

## 2017-08-11 NOTE — Progress Notes (Signed)
DAR NOTE: Patient presents with anxious affect and depressed mood.  Denies pain, auditory and visual hallucinations.  Rates depression at 3, hopelessness at 10, and anxiety at 6.  Maintained on routine safety checks.  Medications given as prescribed.  Support and encouragement offered as needed.  Attended group and participated. Patient observed socializing with peers in the dayroom.  Offered no complaint.

## 2017-08-11 NOTE — [facility] Group Notes (Signed)
LCSW Group Therapy Note 08/11/2017 4:00 PM  Type of Therapy/Topic: Group Therapy: Balance in Life  Participation Level: Active  Description of Group:  This group will address the concept of balance and how it feels and looks when one is unbalanced. Patients will be encouraged to process areas in their lives that are out of balance and identify reasons for remaining unbalanced. Facilitators will guide patients in utilizing problem-solving interventions to address and correct the stressor making their life unbalanced. Understanding and applying boundaries will be explored and addressed for obtaining and maintaining a balanced life. Patients will be encouraged to explore ways to assertively make their unbalanced needs known to significant others in their lives, using other group members and facilitator for support and feedback.  Therapeutic Goals: 1. Patient will identify two or more emotions or situations they have that consume much of in their lives. 2. Patient will identify signs/triggers that life has become out of balance:  3. Patient will identify two ways to set boundaries in order to achieve balance in their lives:  4. Patient will demonstrate ability to communicate their needs through discussion and/or role plays  Summary of Patient Progress:  Maurice [name] was engaged and participated throughout the group session. Maurice [name] states that balance is "something you love to do". Maurice [name] reports that he becomes "unbalanced" when people make fun of him and say hurtful things. Maurice [name] states that he plans on "doing me" and focusing on his music and happiness, in order to regain and maintain his balance in life.   Therapeutic Modalities:  Cognitive Behavioral Therapy Solution-Focused Therapy Assertiveness Training   Maurice [name] LCSWA Clinical Social Worker

## 2017-08-11 NOTE — B[facility] Group Notes (Signed)
Pt came into group at the end. Pt states that he don't have a goal for today.

## 2017-08-12 LAB — BASIC METABOLIC PANEL
ANION GAP: 12 (ref 5–15)
BUN: 17 mg/dL (ref 6–20)
CHLORIDE: 105 mmol/L (ref 101–111)
CO2: 24 mmol/L (ref 22–32)
Calcium: 9.1 mg/dL (ref 8.9–10.3)
Creatinine, Ser: 1.01 mg/dL (ref 0.61–1.24)
GFR calc Af Amer: >60 mL/min (ref >60)
GFR calc non Af Amer: >60 mL/min (ref >60)
GLUCOSE: 106 mg/dL — AB (ref 65–99)
POTASSIUM: 4.1 mmol/L (ref 3.5–5.1)
Sodium: 141 mmol/L (ref 135–145)

## 2017-08-12 NOTE — Progress Notes (Signed)
DATA ACTION RESPONSE  Objective-Pt. is visible in the dayroom, seen watching the game andinteracting loudly with peers.Presents with an animatedaffect and mood. Pt is calmer this evening.C/o of insomnia and anxiety.  Subjective-Denies having any SI/HI/AVH at this time. Rates pain 6/10; generalized. Is cooperative and remains safe on the unit.  1:1 interaction in private to establish rapport. Encouragement, education, &support given from staff. PRN tylenol, ativan, and trazodonerequested and will re-eval accordingly.  Safety maintained with Q 15 checks. Continue with POC.

## 2017-08-12 NOTE — Progress Notes (Signed)
Nursing 1:1 Note @ 2100 Next note @ 0100   D: Pt observed in the dayroom interacting with sitter. Pt appears more irritable being on a 1:1. Pt denies SI/HI/AVH at this time. Pt requested bedtime meds early. Pt states he wants to be discharge soon.  A: 1:1 observation continues for safety. Sitter within arms reach.  R: Pt remains safe at this time.

## 2017-08-12 NOTE — Progress Notes (Signed)
1700 Observation note: Pt observed in his room his room silly and goofing off. Pt noted to be hyper and impulsive. Pt hyper-verbal with a flight of ideas. Pt requires redirecting by sitter due to ongoing inappropriate behaviors. Pt remains on 1:1 observation for safety.
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
## 2017-08-12 NOTE — Progress Notes (Signed)
Pt noted to be easily agitated, anxious and fidgety this am/afternoon. Pt was observed yelling and cursing in the hallway. Writer head pt in the hallway referring to people as Bitches and saying other derogatory remarks. Staff had to speak with pt in regards to inappropriate behaviors on the unit. Pt stated that he's tired of being here in the hosp but doesn't want to return back home to his mother's house. Pt encouraged to speak with MD about discharge plans.  Pt reported tolerating his meds well and denies any side effects to meds. Medications reviewed with pt. Verbal support provided. Pt encouraged to attend groups. 15 minute checks performed for safety. Pt compliant with tx plan.
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
## 2017-08-12 NOTE — [facility] Group Notes (Signed)
[facility] [employer] Group Therapy      08/12/2017 11:44 AM  Type of Therapy: Mental [employer] Presentation  Participation Level: Active  Participation Quality: Attentive  Affect: Appropriate  Cognitive: Oriented  Insight: Developing/Improving  Engagement in Therapy: Engaged  Modes of Intervention: Discussion, Education and Socialization  Summary of Progress/Problems: Mental Health Association (MHA) Speaker came to talk about his personal journey with mental health. The pt processed ways by which to relate to the speaker. MHA speaker provided handouts and educational information pertaining to groups and services offered by the MHA. Pt was engaged in speaker's presentation and was receptive to resources provided.    Allisson Schindel LCSWA Clinical Social Worker   

## 2017-08-12 NOTE — Progress Notes (Signed)
CSW spoke with the patient's mother/legal guardian, Maurice [name] ([phone]) regarding the patient's discharge plans. CSW spoke with the patient's mother and asked if she had any concerns if the patient was discharged to her care, tomorrow morning(08/13/17).   Per the patient's mother, she DOES NOT have any concerns for the patient returning home tomorrow.   She also shared that she was informed that the [name] team has found group home placement, however the process has not been completed for authorization.   CSW also shared that a referral for a possible ACTT team has been initiated in addition to searches for possible respite services for additional support by the [name],  until the patient can be admitted to his new group home placement.    CSW also spoke with the [facility]'s [name], Maurice [name] [phone]. Per Maurice [name] the patient would need an adult mental health diagnosis, in order to qualify for ACTT services. Per Maurice [name] she will continue to follow for ACTT services and the patient's authorization for a new group home placement.   Dr. Madaline GuthrieFernando Cobos has been notified.    [name], MSW, LCSWA Clinical Social Worker [name]  Phone: [phone]

## 2017-08-12 NOTE — Tx Team (Signed)
Interdisciplinary Treatment and Diagnostic Plan Update  08/12/2017 Time of Session: 0850 [address] MRN: [id number]  Principal Diagnosis: MDD (major depressive disorder), recurrent severe, without psychosis (HCC)  Secondary Diagnoses: Principal Problem:   MDD (major depressive disorder), recurrent severe, without psychosis (HCC) Active Problems:   Hypertension   Current Medications:  Current Facility-Administered Medications  Medication Dose Route Frequency Provider Last Rate Last Dose  . acetaminophen (TYLENOL) tablet 650 mg  650 mg Oral Q6H PRN Laveda Abbe, NP   650 mg at 08/11/17 2134  . albuterol (PROVENTIL HFA;VENTOLIN HFA) 108 (90 Base) MCG/ACT inhaler 2 puff  2 puff Inhalation Q6H PRN Laveda Abbe, NP      . alum & mag hydroxide-simeth (MAALOX/MYLANTA) 200-200-20 MG/5ML suspension 30 mL  30 mL Oral Q4H PRN Laveda Abbe, NP      . budesonide (PULMICORT) nebulizer solution 0.5 mg  0.5 mg Nebulization BID Laveda Abbe, NP   0.5 mg at 08/08/17 0849  . cloNIDine (CATAPRES) tablet 0.05 mg  0.05 mg Oral BID PRN Antonieta Pert, MD      . fluticasone [facility]) 50 MCG/ACT nasal spray 2 spray  2 spray Each Nare Daily Antonieta Pert, MD   2 spray at 08/12/17 0818  . gabapentin (NEURONTIN) capsule 600 mg  600 mg Oral TID Antonieta Pert, MD   600 mg at 08/12/17 0818  . loratadine (CLARITIN) tablet 10 mg  10 mg Oral Daily Laveda Abbe, NP   10 mg at 08/12/17 0819  . LORazepam (ATIVAN) tablet 0.5 mg  0.5 mg Oral Q6H PRN Cobos, Rockey Situ, MD   0.5 mg at 08/11/17 2133  . magnesium hydroxide (MILK OF MAGNESIA) suspension 30 mL  30 mL Oral Daily PRN Laveda Abbe, NP      . menthol-cetylpyridinium (CEPACOL) lozenge 3 mg  1 lozenge Oral PRN Nira Conn A, NP      . montelukast (SINGULAIR) chewable tablet 5 mg  5 mg Oral [address] Gerlene Burdock, [name]   5 mg at 08/12/17 1324  . nicotine polacrilex (NICORETTE) gum 2 mg  2 mg Oral PRN  Cobos, Rockey Situ, MD   2 mg at 08/04/17 1431  . pantoprazole (PROTONIX) EC tablet 40 mg  40 mg Oral Daily Laveda Abbe, NP   40 mg at 08/12/17 0818  . propranolol (INDERAL) tablet 40 mg  40 mg Oral TID Antonieta Pert, MD   40 mg at 08/12/17 0817  . traZODone (DESYREL) tablet 50 mg  50 mg Oral QHS PRN Cobos, Rockey Situ, MD   50 mg at 08/11/17 2133  . ziprasidone (GEODON) capsule 80 mg  80 mg Oral BID WC Laveda Abbe, NP   80 mg at 08/12/17 0818   PTA Medications: Medications Prior to Admission  Medication Sig Dispense Refill Last Dose  . albuterol (PROAIR HFA) 108 (90 Base) MCG/ACT inhaler Inhale 2 puffs into the lungs every 6 (six) hours as needed for wheezing or shortness of breath. 1 Inhaler 0 Past Month at Unknown time  . cetirizine (ZYRTEC) 10 MG tablet Take 1 tablet (10 mg total) by mouth daily. 30 tablet 0 07/26/2017 at Unknown time  . clindamycin-benzoyl peroxide (BENZACLIN) gel Apply 1 application topically See admin instructions. Apply thin amount to face every other night (alternate with differin gel)   Past Week at Unknown time  . fluticasone (FLONASE) 50 MCG/ACT nasal spray Place 1 spray into both nostrils daily. (Patient taking differently: Place  1 spray into both nostrils daily as needed for allergies. ) 16 g 0 07/26/2017 at Unknown time  . fluticasone (FLOVENT HFA) 220 MCG/ACT inhaler Inhale 2 puffs into the lungs daily.    07/26/2017 at Unknown time  . gabapentin (NEURONTIN) 300 MG capsule Take 300 mg by mouth 2 (two) times daily.    07/26/2017 at Unknown time  . guanFACINE (INTUNIV) 2 MG TB24 ER tablet Take 4 mg by mouth daily.   2 07/26/2017 at Unknown time  . montelukast (SINGULAIR) 10 MG tablet Take 1 tablet (10 mg total) by mouth at bedtime. (Patient not taking: Reported on 08/03/2016) 30 tablet 0 Not Taking at Unknown time  . montelukast (SINGULAIR) 5 MG chewable tablet Chew 5 mg by mouth at bedtime.   07/26/2017 at Unknown time  . naproxen (NAPROSYN) 375 MG  tablet Take 1 tablet (375 mg total) by mouth 2 (two) times daily. (Patient not taking: Reported on 07/27/2017) 20 tablet 0 Not Taking at Unknown time  . omeprazole (PRILOSEC) 10 MG capsule Take 1 capsule (10 mg total) by mouth daily. (Patient taking differently: Take 10 mg by mouth daily before breakfast. ) 30 capsule 0 07/26/2017 at Unknown time  . traZODone (DESYREL) 100 MG tablet Take 100 mg by mouth at bedtime.   07/26/2017 at Unknown time  . ziprasidone (GEODON) 80 MG capsule Take 80 mg by mouth 2 (two) times daily with a meal.   07/26/2017 at Unknown time    Patient Stressors: Marital or family conflict  Patient Strengths: Wellsite geologist fund of knowledge Physical Health Special hobby/interest Supportive family/friends  Treatment Modalities: Medication Management, Group therapy, Case management,  1 to 1 session with clinician, Psychoeducation, Recreational therapy.   Physician Treatment Plan for Primary Diagnosis: MDD (major depressive disorder), recurrent severe, without psychosis (HCC) Long Term Goal(s): Improvement in symptoms so as ready for discharge Improvement in symptoms so as ready for discharge   Short Term Goals: Ability to identify changes in lifestyle to reduce recurrence of condition will improve Ability to verbalize feelings will improve Ability to disclose and discuss suicidal ideas Ability to demonstrate self-control will improve Ability to identify and develop effective coping behaviors will improve Ability to identify changes in lifestyle to reduce recurrence of condition will improve Ability to maintain clinical measurements within normal limits will improve  Medication Management: Evaluate patient's response, side effects, and tolerance of medication regimen.  Therapeutic Interventions: 1 to 1 sessions, Unit Group sessions and Medication administration.  Evaluation of Outcomes: Progressing  Physician Treatment Plan for Secondary Diagnosis:  Principal Problem:   MDD (major depressive disorder), recurrent severe, without psychosis (HCC) Active Problems:   Hypertension  Long Term Goal(s): Improvement in symptoms so as ready for discharge Improvement in symptoms so as ready for discharge   Short Term Goals: Ability to identify changes in lifestyle to reduce recurrence of condition will improve Ability to verbalize feelings will improve Ability to disclose and discuss suicidal ideas Ability to demonstrate self-control will improve Ability to identify and develop effective coping behaviors will improve Ability to identify changes in lifestyle to reduce recurrence of condition will improve Ability to maintain clinical measurements within normal limits will improve     Medication Management: Evaluate patient's response, side effects, and tolerance of medication regimen.  Therapeutic Interventions: 1 to 1 sessions, Unit Group sessions and Medication administration.  Evaluation of Outcomes: Progressing   RN Treatment Plan for Primary Diagnosis: MDD (major depressive disorder), recurrent severe, without psychosis (HCC) Long Term  Goal(s): Knowledge of disease and therapeutic regimen to maintain health will improve  Short Term Goals: Ability to remain free from injury will improve, Ability to verbalize feelings will improve, Ability to disclose and discuss suicidal ideas, Ability to identify and develop effective coping behaviors will improve and Compliance with prescribed medications will improve  Medication Management: RN will administer medications as ordered by provider, will assess and evaluate patient's response and provide education to patient for prescribed medication. RN will report any adverse and/or side effects to prescribing provider.  Therapeutic Interventions: 1 on 1 counseling sessions, Psychoeducation, Medication administration, Evaluate responses to treatment, Monitor vital signs and CBGs as ordered, Perform/monitor  CIWA, COWS, AIMS and Fall Risk screenings as ordered, Perform wound care treatments as ordered.  Evaluation of Outcomes: Progressing   LCSW Treatment Plan for Primary Diagnosis: MDD (major depressive disorder), recurrent severe, without psychosis (HCC) Long Term Goal(s): Safe transition to appropriate next level of care at discharge, Engage patient in therapeutic group addressing interpersonal concerns.  Short Term Goals: Engage patient in aftercare planning with referrals and resources, Increase social support, Increase ability to appropriately verbalize feelings, Increase emotional regulation, Identify triggers associated with mental health/substance abuse issues and Increase skills for wellness and recovery  Therapeutic Interventions: Assess for all discharge needs, 1 to 1 time with Social worker, Explore available resources and support systems, Assess for adequacy in community support network, Educate family and significant other(s) on suicide prevention, Complete Psychosocial Assessment, Interpersonal group therapy.  Evaluation of Outcomes: Progressing   Progress in Treatment: Attending groups: Yes. Participating in groups: Yes. Hyper-verbal and manic at times. Taking medication as prescribed: Yes. Toleration medication: Yes. Family/Significant other contact made: Yes, individual(s) contacted:  the patient's mother, Sandrea HammondWendy Rosensteel Patient understands diagnosis: Yes. Discussing patient identified problems/goals with staff: Yes. Medical problems stabilized or resolved: Yes. Denies suicidal/homicidal ideation: No. Patient endorses SI to Clinical research associatewriter. Issues/concerns per patient self-inventory: No.   New problem(s) identified: No, Describe:  CSW continuing to assess  New Short Term/Long Term Goal(s): Patient does not have a goal.   Discharge Plan or Barriers: Patient will return home with his mother until his [facility] Coordinator team can find another group home placement. The process for a  new group home and possible respite care options has been initiated by [facility]. The patient will discharge home with his mother and will [facility] for [facility] and therapy services.   Reason for Continuation of Hospitalization: Anxiety Depression Suicidal ideation  Estimated Length of Stay: Friday, 08/14/17  Attendees: Patient: Rennis PettyJaivon Stella  08/07/2017   Physician: Dr Jola Babinskilary, MD; Dr. Nehemiah MassedFernando Cobos, MD 08/07/2017   Nursing: Lamount Crankerhris Judge, RN; Liborio NixonPatrice White, RN 08/07/2017   RN Care Manager: Juliann ParesX 08/07/2017   Social Worker: Daleen SquibbGreg Wierda, LCSW; Baldo DaubJolan Ned Kakar, LCSWA 08/07/2017   Recreational Therapist: X 08/07/2017   Other: X 08/07/2017   Other: X 08/07/2017        Scribe for Treatment Team: Darreld Mcleanharlotte C Hoy, Student-Social Work 08/12/2017 8:58 AM

## 2017-08-12 NOTE — Progress Notes (Signed)
Recreation Therapy Notes  Date: 4.17.19 Time: 9:30 a.m. Location: [address] Dayroom   Group Topic: Stress Management   Goal Area(s) Addresses:  Goal 1.1: To reduce stress  -Patient will feel a reduction in stress level  -Patient will understand the importance of stress management  -Patient will participate during stress management group      Intervention: Stress Management  Activity: Guided Imagery: Patients were in a peaceful environment with soft lighting enhancing patients mood. Patients listened to a deep concentration meditation from the calm app.  Education: Stress Management, Discharge Planning.    Education Outcome: Acknowledges edcuation/In group clarification offered/Needs additional education   Clinical Observations/Feedback:: Patient did not attend     Sheryle HailDarian Shakora Nordquist, Recreation Therapy Intern   Sheryle HailDarian Demya Scruggs 08/12/2017 8:41 AM

## 2017-08-13 MED ORDER — CLONIDINE HCL 0.1 MG PO TABS: 0.0500 mg | ORAL_TABLET | Freq: Two times a day (BID) | ORAL | 0 refills | Status: DC | PRN

## 2017-08-13 MED ORDER — FLUTICASONE PROPIONATE 50 MCG/ACT NA SUSP: 2.0000 | Freq: Every day | NASAL | 0 refills | Status: AC

## 2017-08-13 MED ORDER — MONTELUKAST SODIUM 5 MG PO CHEW: 5.0000 mg | CHEWABLE_TABLET | ORAL | Status: DC

## 2017-08-13 MED ORDER — FLUTICASONE PROPIONATE HFA 220 MCG/ACT IN AERO: 2.0000 | INHALATION_SPRAY | Freq: Every day | RESPIRATORY_TRACT | 12 refills | Status: DC

## 2017-08-13 MED ORDER — ALBUTEROL SULFATE HFA 108 (90 BASE) MCG/ACT IN AERS: 2.0000 | INHALATION_SPRAY | Freq: Four times a day (QID) | RESPIRATORY_TRACT | 0 refills | Status: DC | PRN

## 2017-08-13 MED ORDER — PANTOPRAZOLE SODIUM 40 MG PO TBEC: 40.0000 mg | DELAYED_RELEASE_TABLET | Freq: Every day | ORAL | 0 refills | Status: DC

## 2017-08-13 MED ORDER — MONTELUKAST SODIUM 10 MG PO TABS: 10.0000 mg | ORAL_TABLET | Freq: Every day | ORAL | 0 refills | Status: DC

## 2017-08-13 MED ORDER — TRAZODONE HCL 50 MG PO TABS: 50.0000 mg | ORAL_TABLET | Freq: Every evening | ORAL | 0 refills | Status: DC | PRN

## 2017-08-13 MED ORDER — BUDESONIDE 0.5 MG/2ML IN SUSP: 0.5000 mg | Freq: Two times a day (BID) | RESPIRATORY_TRACT | 0 refills | Status: DC

## 2017-08-13 MED ORDER — NICOTINE POLACRILEX 2 MG MT GUM: 2.0000 mg | CHEWING_GUM | OROMUCOSAL | 0 refills | Status: DC | PRN

## 2017-08-13 MED ORDER — GABAPENTIN 300 MG PO CAPS: 600.0000 mg | ORAL_CAPSULE | Freq: Three times a day (TID) | ORAL | 0 refills | Status: DC

## 2017-08-13 MED ORDER — LORATADINE 10 MG PO TABS: 10.0000 mg | ORAL_TABLET | Freq: Every day | ORAL | Status: DC

## 2017-08-13 MED ORDER — PROPRANOLOL HCL 40 MG PO TABS: 40.0000 mg | ORAL_TABLET | Freq: Three times a day (TID) | ORAL | 0 refills | Status: AC

## 2017-08-13 MED ORDER — ZIPRASIDONE HCL 80 MG PO CAPS: 80.0000 mg | ORAL_CAPSULE | Freq: Two times a day (BID) | ORAL | 0 refills | Status: AC
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
## 2017-08-13 NOTE — Progress Notes (Signed)
  [facility] Adult Case Management Discharge Plan :  Will you be returning to the same living situation after discharge:  No. Patient is discharging with his legal guardian/mother, Sandrea HammondWendy Chewning to their home. At discharge, do you have transportation home?: Yes,  the patient's mother, Sandrea HammondWendy Wyne reports she is picking the patient up at 1:00pm.  Do you have the ability to pay for your medications: Yes,  Medicaid  Release of information consent forms completed and in the chart;  Patient's signature needed at discharge.  Patient to Follow up at: Follow-up Information    [facility]. Go on 08/18/2017.   Why:  Tuesday at 8 AM with Ginnie SmartKelly Fargas for your hospital follow up appointment. Bring along your ID, your MCD card and your hospital d/c paperwork.  Your legal guardian must attend this appt with you. Contact information: [address] [place] [address] [phone]           Next level of care provider has access to [facility] Link:yes  Safety Planning and Suicide Prevention discussed: Yes,  with the patient's mother/legal guardian, Sandrea HammondWendy Pajak  Have you used any form of tobacco in the last 30 days? (Cigarettes, Smokeless Tobacco, Cigars, and/or Pipes): No  Has patient been referred to the Quitline?: N/A patient is not a smoker  Patient has been referred for addiction treatment: N/A  Maeola SarahJolan E Clee Pandit, LCSWA 08/13/2017, 11:10 AM

## 2017-08-13 NOTE — Progress Notes (Signed)
Pt noted to be in his room, awake with sitter present. No inappropriate behaviors noted. Pt appears sad on approach. Pt remains on 1:1 observation for safety.

## 2017-08-13 NOTE — Progress Notes (Addendum)
Nursing 1:1Note@ 0500 Next note @ 0900  D: Pt observedin room, seen laying in bed with eyes closed. Respirations even and unlabored. No acute distress.  A: 1:1 observation continues for safety. Sitter within arms reach. R:Pt remains safeat this time

## 2017-08-13 NOTE — [facility] Group Notes (Deleted)
[facility] Group Notes:  (Nursing/MHT/Case Management/Adjunct)  Date:  08/13/2017  Time:  5:41 PM  Type of Therapy:  Identifying mental health crises worksheet, identifying triggers, and working on coping mechanism.   Participation Level:  Pt did not attend  Participation Quality:  Pt did not attend  Affect:   Pt did not attend  Cognitive:   Pt did not attend  Insight:   Pt did not attend  Engagement in Group:   Pt did not attend  Modes of Intervention:  Pt did not attend  Summary of Progress/Problems:  Pt did not attend Maurice [name]  Maurice [name] 08/13/2017, 5:41 PM

## 2017-08-13 NOTE — Progress Notes (Signed)
Discharge note: Pt and pt's mother Toniann FailWendy both present at time of discharge. Wrtier provided the pt and his mother with written and verbal discharge instructions. Understanding of discharge instructions verbalized by Toniann FailWendy. Writer provided the pt's mother with d/c packet and prescriptions. Pt gathered his belongings from room and locker. During discharge pt and his mother were both argumentative towards each other. Pt mother then walked out of the search room frustrated while pt was obtaining his belongings. Writer escorted pt to the parking lot to his mother's car. Pt safely discharged to his mother.

## 2017-08-13 NOTE — [facility] Suicide Risk Assessment (Addendum)
[facility] Discharge Suicide Risk Assessment   Principal Problem: Depression, Impulsivity  Discharge Diagnoses:  Patient Active Problem List   Diagnosis Date Noted  . MDD (major depressive disorder), recurrent severe, without psychosis (HCC) [F33.2] 07/28/2017  . Aggressive behavior [R46.89] 11/30/2015  . Hypertension [I10]   . Intentional overdose of drug in tablet form (HCC) [T50.902A]   . Other secondary hypertension [I15.8]   . Somnolence [R40.0]   . Overdose [T50.901A] 11/09/2015  . GERD (gastroesophageal reflux disease) [K21.9] 07/20/2015  . Appetite lost [R63.0] 07/20/2015  . Suicidal ideation [R45.851]   . DMDD (disruptive mood dysregulation disorder) (HCC) [F34.81] 03/13/2015  . Homicidal ideations [R45.850] 03/13/2015  . Intellectual disability [F79] 02/26/2015  . Attention-deficit hyperactivity disorder, predominantly hyperactive type [F90.1]   . ADHD (attention deficit hyperactivity disorder), combined type [F90.2] 01/31/2015  . Oppositional defiant disorder [F91.3] 01/31/2015  . Mood disorder (HCC) [F39] 01/31/2015    Total Time spent with patient: 30 minutes  Musculoskeletal: Strength & Muscle Tone: within normal limits Gait & Station: normal Patient leans: N/A  Psychiatric Specialty Exam: ROS denies headache, no chest pain, no shortness of breath, no vomiting , no fever  Blood pressure 129/72, pulse 83, temperature 97.8 F (36.6 C), temperature source Oral, resp. rate 16, height 5\' 7"  (1.702 m), weight 67.1 kg (148 lb), SpO2 98 %.Body mass index is 23.18 kg/m.  General Appearance: Well Groomed  Eye Contact::  Good  Speech:  Normal Rate409  Volume:  Normal  Mood:  reports mood "OK"  Affect:  vaguely irritable, does smile briefly at times   Thought Process:  Linear and Descriptions of Associations: Intact  Orientation:  Full (Time, Place, and Person)  Thought Content:  denies hallucinations, no delusions, not internally preoccupied   Suicidal Thoughts:  No today  denies any suicidal or self injurious ideations and also denies any homicidal or violent ideations. Specifically denies any violent or homicidal ideations towards mother  Homicidal Thoughts:  No  Memory:  recent and remote grossly intact   Judgement:  Other:  fair- improving   Insight:  Fair and improving   Psychomotor Activity:  Normal  Concentration:  Good  Recall:  Good  Fund of Knowledge:Good  Language: Good  Akathisia:  Negative  Handed:  Right  AIMS (if indicated):     Assets:  Desire for Improvement Resilience  Sleep:  Number of Hours: 6.5  Cognition: WNL  ADL's:  Intact   Mental Status Per Nursing Assessment::   On Admission:  Suicidal ideation indicated by patient  Demographic Factors:  22 year old single male, lives with mother  Loss Factors: Reports stress related to relationship with mother, unemployment  Historical Factors: History of prior psychiatric admissions , history of ODD, ADHD, Depression. History of impulsivity, intermittent explosiveness in the past   Risk Reduction Factors:   Living with another person, especially a relative and Positive coping skills or problem solving skills  Continued Clinical Symptoms:  At this time patient alert,attentive, calm, reports mood "OK, affect vaguely irritable, but tends to improve partially during session, no thought disorder, today denies suicidal or self injurious ideations, and also denies any homicidal or violent ideations, also specifically denies any violent or Homicidal ideations towards mother. Presents future oriented, and remains focused on going to a group home setting when available . Denies medication side effect.    Cognitive Features That Contribute To Risk:  No gross cognitive deficits noted upon discharge. Is alert , attentive, and oriented x 3   Suicide  Risk:  Mild:  Suicidal ideation of limited frequency, intensity, duration, and specificity.  There are no identifiable plans, no associated intent,  mild dysphoria and related symptoms, good self-control (both objective and subjective assessment), few other risk factors, and identifiable protective factors, including available and accessible social support.  Follow-up Information    [facility]. Go on 08/18/2017.   Why:  Tuesday at 8 AM with Ginnie SmartKelly Fargas for your hospital follow up appointment. Bring along your ID, your MCD card and your hospital d/c paperwork.  Your legal guardian must attend this appt with you. Contact information: [address] [place] [address] [phone]           Plan Of Care/Follow-up recommendations:  Activity:  as tolerated  Diet:  heart healthy Tests:  NA Other:  See below   Patient is returning home to mother, who has guardianship of patient. His plan is to transition to Group Home setting when available. Follow up as above . ACT referral initiated by CSW/Treatment team.   Craige CottaFernando A Cobos, MD 08/13/2017, 11:19 AM

## 2017-08-13 NOTE — Progress Notes (Signed)
Observation note: Pt observed sitting in group with sitter present. Pt appears to be calm and cooperative. No inappropriate behaviors observed. Pt remains on 1:1 observation for safety.

## 2017-08-13 NOTE — Progress Notes (Addendum)
Nursing 1:1 Note @ 0100 Next note @ 0500  D: Pt observed in room, seen laying in bed with eyes closed. Respirations even and unlabored. No acute distress.  A: 1:1 observation continues for safety. Sitter within arms reach.  R: Pt remains safe at this time
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
## 2017-08-24 ENCOUNTER — Encounter (HOSPITAL_COMMUNITY): Payer: Self-pay | Admitting: Emergency Medicine

## 2017-08-24 ENCOUNTER — Emergency Department (HOSPITAL_COMMUNITY)
Admission: EM | Admit: 2017-08-24 | Discharge: 2017-08-31 | Disposition: A | Discharge status: Home / Self Care | Payer: Medicaid Other | Attending: Emergency Medicine | Admitting: Emergency Medicine

## 2017-08-24 ENCOUNTER — Other Ambulatory Visit: Payer: Self-pay

## 2017-08-24 DIAGNOSIS — F332 Major depressive disorder, recurrent severe without psychotic features: Secondary | ICD-10-CM | POA: Insufficient documentation

## 2017-08-24 DIAGNOSIS — F1210 Cannabis abuse, uncomplicated: Secondary | ICD-10-CM | POA: Diagnosis not present

## 2017-08-24 DIAGNOSIS — I10 Essential (primary) hypertension: Secondary | ICD-10-CM | POA: Insufficient documentation

## 2017-08-24 DIAGNOSIS — J45909 Unspecified asthma, uncomplicated: Secondary | ICD-10-CM | POA: Insufficient documentation

## 2017-08-24 DIAGNOSIS — F4325 Adjustment disorder with mixed disturbance of emotions and conduct: Secondary | ICD-10-CM | POA: Diagnosis present

## 2017-08-24 DIAGNOSIS — F902 Attention-deficit hyperactivity disorder, combined type: Secondary | ICD-10-CM | POA: Diagnosis not present

## 2017-08-24 DIAGNOSIS — R45850 Homicidal ideations: Secondary | ICD-10-CM | POA: Insufficient documentation

## 2017-08-24 DIAGNOSIS — R45851 Suicidal ideations: Secondary | ICD-10-CM | POA: Diagnosis not present

## 2017-08-24 DIAGNOSIS — Z79899 Other long term (current) drug therapy: Secondary | ICD-10-CM | POA: Insufficient documentation

## 2017-08-24 DIAGNOSIS — R4689 Other symptoms and signs involving appearance and behavior: Secondary | ICD-10-CM | POA: Diagnosis present

## 2017-08-24 LAB — BASIC METABOLIC PANEL
Anion gap: 11 (ref 5–15)
BUN: 17 mg/dL (ref 6–20)
CO2: 22 mmol/L (ref 22–32)
CREATININE: 1.42 mg/dL — AB (ref 0.61–1.24)
Calcium: 8.8 mg/dL — ABNORMAL LOW (ref 8.9–10.3)
Chloride: 103 mmol/L (ref 101–111)
GFR calc Af Amer: >60 mL/min (ref >60)
GLUCOSE: 90 mg/dL (ref 65–99)
Potassium: 4.1 mmol/L (ref 3.5–5.1)
Sodium: 136 mmol/L (ref 135–145)

## 2017-08-24 LAB — CK
CK TOTAL: 1185 U/L — AB (ref 49–397)
CK TOTAL: 1118 U/L — AB (ref 49–397)
Total CK: 1035 U/L — ABNORMAL HIGH (ref 49–397)

## 2017-08-24 LAB — RAPID URINE DRUG SCREEN, HOSP PERFORMED
Amphetamines: NOT DETECTED
Barbiturates: NOT DETECTED
Benzodiazepines: NOT DETECTED
Cocaine: NOT DETECTED
Opiates: NOT DETECTED
Tetrahydrocannabinol: NOT DETECTED

## 2017-08-24 LAB — COMPREHENSIVE METABOLIC PANEL
ALT: 28 U/L (ref 17–63)
AST: 44 U/L — ABNORMAL HIGH (ref 15–41)
Albumin: 4.9 g/dL (ref 3.5–5.0)
Alkaline Phosphatase: 138 U/L — ABNORMAL HIGH (ref 38–126)
Anion gap: 12 (ref 5–15)
BUN: 21 mg/dL — ABNORMAL HIGH (ref 6–20)
CO2: 25 mmol/L (ref 22–32)
Calcium: 9.5 mg/dL (ref 8.9–10.3)
Chloride: 103 mmol/L (ref 101–111)
Creatinine, Ser: 1.66 mg/dL — ABNORMAL HIGH (ref 0.61–1.24)
GFR calc Af Amer: >60 mL/min (ref >60)
GFR calc non Af Amer: 58 mL/min — ABNORMAL LOW (ref >60)
Glucose, Bld: 93 mg/dL (ref 65–99)
Potassium: 4 mmol/L (ref 3.5–5.1)
Sodium: 140 mmol/L (ref 135–145)
Total Bilirubin: 1.4 mg/dL — ABNORMAL HIGH (ref 0.3–1.2)
Total Protein: 8.6 g/dL — ABNORMAL HIGH (ref 6.5–8.1)

## 2017-08-24 LAB — CBC
HCT: 46.5 % (ref 39.0–52.0)
Hemoglobin: 15.4 g/dL (ref 13.0–17.0)
MCH: 26.9 pg (ref 26.0–34.0)
MCHC: 33.1 g/dL (ref 30.0–36.0)
MCV: 81.3 fL (ref 78.0–100.0)
Platelets: 291 10*3/uL (ref 150–400)
RBC: 5.72 MIL/uL (ref 4.22–5.81)
RDW: 14.8 % (ref 11.5–15.5)
WBC: 11.8 10*3/uL — ABNORMAL HIGH (ref 4.0–10.5)

## 2017-08-24 LAB — SALICYLATE LEVEL: Salicylate Lvl: <7 mg/dL (ref 2.8–30.0)

## 2017-08-24 LAB — ETHANOL: Alcohol, Ethyl (B): <10 mg/dL (ref <10)

## 2017-08-24 LAB — ACETAMINOPHEN LEVEL: Acetaminophen (Tylenol), Serum: <10 ug/mL — ABNORMAL LOW (ref 10–30)

## 2017-08-24 MED ORDER — BUDESONIDE 0.5 MG/2ML IN SUSP
0.5000 mg | Freq: Two times a day (BID) | RESPIRATORY_TRACT | Status: DC
  Filled 2017-08-24 (×8): qty 2

## 2017-08-24 MED ORDER — PANTOPRAZOLE SODIUM 40 MG PO TBEC
40.0000 mg | DELAYED_RELEASE_TABLET | Freq: Every day | ORAL | Status: DC
  Administered 2017-08-25 – 2017-08-31 (×7): 40 mg via ORAL
  Filled 2017-08-24 (×7): qty 1

## 2017-08-24 MED ORDER — GABAPENTIN 300 MG PO CAPS
600.0000 mg | ORAL_CAPSULE | Freq: Three times a day (TID) | ORAL | Status: DC
  Administered 2017-08-25 – 2017-08-31 (×19): 600 mg via ORAL
  Filled 2017-08-24 (×19): qty 2

## 2017-08-24 MED ORDER — MONTELUKAST SODIUM 5 MG PO CHEW
5.0000 mg | CHEWABLE_TABLET | Freq: Every day | ORAL | Status: DC
  Administered 2017-08-25 – 2017-08-31 (×7): 5 mg via ORAL
  Filled 2017-08-24 (×7): qty 1

## 2017-08-24 MED ORDER — SODIUM CHLORIDE 0.9 % IV BOLUS
1000.0000 mL | Freq: Once | INTRAVENOUS | Status: AC
  Administered 2017-08-24: 1000 mL via INTRAVENOUS

## 2017-08-24 MED ORDER — TRAZODONE HCL 50 MG PO TABS
50.0000 mg | ORAL_TABLET | Freq: Every evening | ORAL | Status: DC | PRN
  Administered 2017-08-24 – 2017-08-30 (×6): 50 mg via ORAL
  Filled 2017-08-24 (×6): qty 1

## 2017-08-24 MED ORDER — PROPRANOLOL HCL 40 MG PO TABS
40.0000 mg | ORAL_TABLET | Freq: Three times a day (TID) | ORAL | Status: DC
  Administered 2017-08-25 – 2017-08-31 (×17): 40 mg via ORAL
  Filled 2017-08-24 (×23): qty 1

## 2017-08-24 MED ORDER — FLUTICASONE PROPIONATE HFA 220 MCG/ACT IN AERO: 2.0000 | INHALATION_SPRAY | Freq: Every day | RESPIRATORY_TRACT | Status: DC

## 2017-08-24 MED ORDER — ZIPRASIDONE HCL 20 MG PO CAPS
80.0000 mg | ORAL_CAPSULE | Freq: Two times a day (BID) | ORAL | Status: DC
  Administered 2017-08-25 – 2017-08-31 (×13): 80 mg via ORAL
  Filled 2017-08-24 (×13): qty 4

## 2017-08-24 MED ORDER — FLUTICASONE PROPIONATE 50 MCG/ACT NA SUSP
2.0000 | Freq: Every day | NASAL | Status: DC
  Administered 2017-08-26 – 2017-08-31 (×3): 2 via NASAL
  Filled 2017-08-24: qty 16

## 2017-08-24 MED ORDER — CLONIDINE HCL 0.1 MG PO TABS
0.0500 mg | ORAL_TABLET | Freq: Two times a day (BID) | ORAL | Status: DC | PRN
  Administered 2017-08-27 – 2017-08-30 (×4): 0.05 mg via ORAL
  Filled 2017-08-24 (×4): qty 1

## 2017-08-24 MED ORDER — ALBUTEROL SULFATE HFA 108 (90 BASE) MCG/ACT IN AERS: 2.0000 | INHALATION_SPRAY | Freq: Four times a day (QID) | RESPIRATORY_TRACT | Status: DC | PRN
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
## 2017-08-24 NOTE — ED Notes (Signed)
Pt actively vomiting. Received an emesis bag.

## 2017-08-24 NOTE — ED Notes (Signed)
Leaphart, PA-C in room to speak with pt and pt had porn pulled up on the hospital computer in room and was masturbating
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
## 2017-08-24 NOTE — Progress Notes (Signed)
CSW intern spoke with patients legal guardian, Alessio Bogan [phone]. Mother informed this Clinical research associate that she would not be able to pick patient up from hospital. However mother gave this writer IDD care coordinator's phone number to contact. IDD care coordinator is Vaughan Sine, [phone].   CSW intern left VM with Ms. Williams, awaiting call back. Will continue to update.  Nicoletta Dress, Social Work Intern  [phone]

## 2017-08-24 NOTE — BH Assessment (Signed)
[facility] Assessment Progress Note     08/24/17 Per Nanine Means, DNP, patient can be discharged home and can follow up with [facility] for outpatient services.

## 2017-08-24 NOTE — BH Assessment (Addendum)
[facility] Assessment Progress Note  Per Juanetta Beets, DO, this pt does not require psychiatric hospitalization at this time.  Pt is psychiatrically cleared.  He is to be advised to follow up with [facility] for his behavioral health needs.  This has been included in pt's discharge instructions.  A social work consult has been entered for this patient.  After reviewing pt's chart, this Clinical research associate called Child psychotherapist, informing them that pt's mother, Marquay Kruse, is his guardian, with a letter of guardianship in his EPIC record.  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  Pt's nurse has been notified.  Doylene Canning, MA Triage Specialist [phone]

## 2017-08-24 NOTE — ED Notes (Signed)
Pt currently sitting up in bed and talking with group home staff for assessment. Pt cooperative at this time with no distress noted. Sitter at bedside for safety.

## 2017-08-24 NOTE — Discharge Instructions (Addendum)
For your mental health needs, you are advised to follow up with [facility].  New and returning patients are seen at their walk-in clinic.  Walk-in hours are Monday - Friday from 8:00 am - 3:00 pm.  Walk-in patients are seen on a first come, first served basis.  Try to arrive as early as possible for he best chance of being seen the same day: ° °     [facility] °     [address] °     [place], [place] [address] °     [phone] °
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
## 2017-08-25 MED ORDER — ACETAMINOPHEN 325 MG PO TABS
650.0000 mg | ORAL_TABLET | Freq: Once | ORAL | Status: AC
  Administered 2017-08-25: 650 mg via ORAL
  Filled 2017-08-25: qty 2

## 2017-08-25 NOTE — Progress Notes (Signed)
CSW spoke with patients [facility] IDD care coordinator, Cleta Alberts [phone], to receive update on patients current disposition. Cleta Alberts states patient has crisis bed with [facility] at the end of the week however is unsure where patient should go in the interim. Mother is currently seeking to place patient into a new group home.   CSW contacted Becky Sax with [facility] to confirm that patient has a crisis bed- crisis bed through [facility] has been confirmed and they should be able to accept patient Thursday/ Friday.   PLAN: If mother is unwilling to take patient home at this time, patient will discharge to [facility] crisis bed at the end of the week.   CSW notified social work Acupuncturist.   Stacy Gardner, [facility] Emergency Room Clinical Social Worker [phone]

## 2017-08-25 NOTE — ED Notes (Signed)
Pt A&O x 3, no distress noted, calm & cooperative, watching TV at present.  Sitter at bedside.  Monitoring for safety.

## 2017-08-26 MED ORDER — CLINDAMYCIN PHOS-BENZOYL PEROX 1-5 % EX GEL: Freq: Two times a day (BID) | CUTANEOUS | 0 refills | Status: DC

## 2017-08-26 MED ORDER — FLUTICASONE PROPIONATE HFA 220 MCG/ACT IN AERO: 2.0000 | INHALATION_SPRAY | Freq: Every day | RESPIRATORY_TRACT | 12 refills | Status: AC

## 2017-08-26 MED ORDER — ADAPALENE 0.1 % EX GEL: Freq: Every day | CUTANEOUS | 0 refills | Status: DC

## 2017-08-26 MED ORDER — ALBUTEROL SULFATE HFA 108 (90 BASE) MCG/ACT IN AERS: 2.0000 | INHALATION_SPRAY | Freq: Four times a day (QID) | RESPIRATORY_TRACT | 0 refills | Status: AC | PRN

## 2017-08-26 NOTE — ED Notes (Signed)
Peer support at bedside 

## 2017-08-26 NOTE — Progress Notes (Addendum)
CSW received a call from Becky Sax of [facility] stating that the pt's transition to a crisis center is being held up due to the pt's use of a nebulizer versus the pt's inhaler.  Per Grover Canavan, it will be helpful if it can be determined if one wold be sufficient or if both are necessary.  Per Grover Canavan, if only the inhaler is necessary there is an order for the nebulizer that would be have to withdrawn, or an order will have to be placed stating the nebulizer is no longer needed.  Per Gayland Curry there is a topical acne cream that the pt uses and Grover Canavan states there will have to be an order placed for the pt to be able to use this acne cream.  Per Leona Carry [facility] is getting close to solidifying transportation for the pt to the crisis shelter.  Per Grover Canavan a meeting is scheduled on the morning of 08/27/17, regarding the pt's disposition to the crisis shelter and the details that are being discussed, such as the one above.  Grover Canavan asks that if these details aren't learned until 5/2 then the ED CSW can update Grover Canavan on 5/2.  CSW will continue to follow for D/C needs.  5:07 PM CSW spoke to pt's mother Mansour Balboa at ph: [phone].   Per pt's mother the pt only used the nebulizer one time after being D/C'd from his hospital visit and that the pt only uses the inhaler.  Per, pt's mother the pt takes BenzaClin for his acne, a topical acne cream and Differin Gel, also for acne.  Per pt's mother the pt was at one time on ADHD medicines (Guanfacine ER ) and the doctor's at [facility] took the pt off it, as well as taking the pt off of FLUFOXAMINE .  5:08 PM CSW confirmed the above information with the pt who stated:  Pt only used the nebulizer once and no longer uses it "due to my age".  Pt only uses two inhalers.  Pt still uses the BenzClin and the Differin Gel for his acne.  CSW confirmed with pt's RN the pt has turned down use of the nebulizer twice on 4/30 and once when offered on 4/29 and  has only used the inhalers.  CSW spoke to the EDP and the EDP wrote pt prescriptions for his two inhalers, prescriptions for the pt's two acne meds *BenzaClin Cream and Differin Gel and the EDP discontinued (this will show up in the AVS) the pt's nebulizer.  CSW will receive a form from Becky Sax via email, request that the EDP fill out and sign the forms along with the EDP's NPI and the CSW will scan and send the form back to Becky Sax via reply to her email on 08/26/17.    CSW will leave a copy of this med form on the 1st shift ED CSW's desk should the need for it arise.  2nd shift ED CSW will leave handoff for 1st shift ED CSW.  6:35 PM CSW emailed Becky Sax at [email or web address] [email or web address] and attached in a SECURE and encrypted email the pt's signed (by the EDP) med form and a copy of the pt''s prescriptions that the pt will be D/C'd with and informed Miss Graves that the EDP's discontinuation of the pt's nebulizer will be shown/reflected in the pt's AVS at discharge.  CSW placed the pt's prescriptions and pt's signed med form in the pt's chart and updated the pt's RN.  Please reconsult if future social work needs  arise.  CSW signing off, as social work intervention is no longer needed.  Dorothe Pea. Cindy Fullman, LCSW, LCAS, CSI Clinical Social Worker Ph: [phone]

## 2017-08-26 NOTE — ED Notes (Signed)
Pt eating dinner tray °

## 2017-08-26 NOTE — ED Notes (Signed)
SOCIAL WORK JONATHAN AT BEDSIDE. ATTEMPTING TO FIND PLACEMENT

## 2017-08-27 NOTE — ED Notes (Signed)
Patient awake, laughing and talking loudly and exiting his room multiple times, attention-seeking and making inappropriate remarks. Pt needing frequent redirection from staff to lower his voice and remain in his room. Pt difficult to redirect at times. Pt given sandwich and drink on request. No distress noted at this time.

## 2017-08-27 NOTE — ED Notes (Signed)
Patient awaiting placement per social work

## 2017-08-27 NOTE — ED Notes (Signed)
Pt is awaiting placement with social work 

## 2017-08-27 NOTE — Progress Notes (Addendum)
CSW spoke with Ilda Mori (IDD care coordinator), Ander Gaster (IDD care coordinator), Becky Sax ([place] [facility] coordinator), and legal guardian via conference call to solidify details of discharge to [facility] crisis bed.   Patient has confirmed bed at [facility] crisis [facility] in [facility] for Monday 5/6. Patient will need to be at the crisis center by 1:30PM to complete admission paperwork with mother/ legal guardian. Patient will need to be in route to crisis center by 12PM. Mother is NOT transporting however will be arriving at the hospital to sign discharge information/ get any scripts needed to take to crisis facility.   Social work department and LME are still working on determining safest plan for transporting patient to crisis center.   Please refer to [facility] for helpful numbers.   Stacy Gardner, [facility] Emergency Room Clinical Social Worker [phone]

## 2017-08-27 NOTE — ED Notes (Signed)
Pt currently asleep with no signs of distress noted. Sitter at bedside for safety.

## 2017-08-27 NOTE — ED Notes (Signed)
Pt continually talking loudly and is displaying manic-type behaviors. Pt needing frequent redirection to lower his voice, as others are trying to sleep at this time. Pt laughing to himself and at the television. Sitter remains at bedside for safety.

## 2017-08-28 MED ORDER — ACETAMINOPHEN 325 MG PO TABS
650.0000 mg | ORAL_TABLET | ORAL | Status: DC | PRN
  Administered 2017-08-28: 650 mg via ORAL
  Filled 2017-08-28: qty 2

## 2017-08-28 NOTE — ED Notes (Signed)
Patient laughing, hyper verbal and attention-seeking at this time. Pt reminded to lower his voice and remain in his room while on unit for safety. Pt denies any thoughts of suicide, homicide or plans to harm himself. No distress noted currently. Sitter at bedside for safety.

## 2017-08-28 NOTE — Progress Notes (Addendum)
1:53pm- CSW spoke with Jamesetta So from [facility] and has scheduled pick up for Monday 5/6 at 12pm. Jamesetta So suggested that they may get to ED by 11:30am on 5/6 just to be sure that everything is ready. CSW spoke with pt's mother to update her about filling prescriptions herself. She expressed being understanding and agreeable to this at this time. Plan is still for pt to go to [facility] on 5/6 at 12pm.   1:41pm- CSW reached out to [place] with [place] to see about getting medications filled with the facility. From CSW's understanding pt's mother is to have the prescriptions filled before getting to the facility. CSW was informed that RNCM has spoken with mother and mother asked this questions and was informed that ED would not fill the prescription given to pt but mother would possible have to do that herself. CSW received call from [facility] and was informed that CSW can go ahead and set up transport with [facility] for Monday at 12pm. CSW will set this up while RNCM reached out to [facility] for further medication needs.   12:00pm- CSW received call from Ilda Mori (IDD Care Coordinator) and was informed that she asked Director at her facility to approve the transport for [facility] on Monday. CSW was informed that Juel Burrow is asking for $475 to take pt to facility. CSW to get call back from [place] on whether or not transport has been approved by [employer]. Larita Fife is speaking with Timothy Lasso as they are requesting that [facility] split the bill for transport with the [facility].   Mother asked if medications would be filled on Monday for her to pick them up form the hospital. CSW explaine that CSW was not sur e but would make efforts to find this information out for he. CSW spoke with Grover Canavan and was asked that [facility] call pt's mother and discuss medications at this time.   11:53am- CSW and RNCM spoke with pt's mother regarding medication history while in the ED. RNCM discussed medication changes and medication  similarities with mother to ensure that mother was understanding and knowledgeable about medications pt was receiving at this time. After conversation mother appeared to be at ease with medications given to pt at this time.   11:37am- CSW received call from [place] with [place] [facility]. Grover Canavan sought further information regarding a medication that pt has been on. CSW spoke with MD Freida Busman and confirmed that pt hs been getting  Singular instead of . CSW updated Jeannene Patella of this and was asked to call mother to inform her. CSW reached out to mother and was unable to leave VM ask mailbox is full.   CSW received call from [place] with  START and was informed that for every medication with a written order  pt will be discharge from the hospital on, the facility will need orders for those medictaions. Grover Canavan expressed that staff follow up with mother about an acne cream if pt will be on this while at the facility. CSW was given a number for Clinical Specialist Darlina Guys [phone]) if there are any specific questions.  Claude Manges Malcolm Hetz, MSW, LCSW-A Emergency Department Clinical Social Worker [phone]

## 2017-08-28 NOTE — Progress Notes (Signed)
2nd shift ED CSW received a phone call from Benjaman Lobe from [facility] who left a VM updating the 1st shift [facility] ED CSW.    Per Miss Cristi Loron, she had spoken to both the 1st shift [facility] ED CSW and the CSW Asst Director And that the following had been agreed upon by the [employer]:  The CSW Asst Director agreed on a plan to pay one half each, of the cost, of the [employer] for the pt on Monday, with [facility] playing half of the transportation cost and [facility] paying for the other half.  Per Miss Marlon [facility] transportation is to take the pt to the [facility] crisis facility on Monday.  5:50 PM CSW confirmed this with the CSW Asst Director via phone.    Per the CSW Asst Director:   The 1st shift [facility] ED CSW will need to reach out to [facility] on Monday May 6th to see if [facility] can split the invoice and if so...  Benjaman Lobe with [facility] at ph: [phone] will need [facility]'s contact info and [facility] will need Orlie Pollen Beatty's contact info also.  Please reconsult if future social work needs arise.  CSW signing off, as social work intervention is no longer needed.  Dorothe Pea. Olan Kurek, LCSW, LCAS, CSI Clinical Social Worker Ph: [phone]

## 2017-08-28 NOTE — ED Notes (Signed)
Pt resting with no acute distress noted. Sitter at bedside.

## 2017-08-28 NOTE — ED Notes (Signed)
Pt refused his 1200 vitals and refused his lunch as well. Pt has been in and out of his room.With strong redirection he will go back in his room for a short period of time.

## 2017-08-28 NOTE — Discharge Planning (Signed)
EDCM and EDSW spoke with pt mom regarding home medications.  Mom concerned that pt was NOT receiving Zyrtec AND Singulair.  EDCM explained that pt has not been on that regimen during this ED visit and Zyrtec is not listed as a home medication.  Mom also asked about prilosec, [facility] informed that pt was receiving prontnix.  Mom appreciative of information.

## 2017-08-29 NOTE — ED Notes (Signed)
Patient hyperactive and dancing around in his room this shift, but is easily redirected by staff. Pt with bright affect. Pt compliant with medications this shift. Pt denies suicidal or homicidal ideations. Sitter at bedside for safety.

## 2017-08-29 NOTE — Progress Notes (Signed)
Patient received snack as requested.

## 2017-08-30 NOTE — ED Notes (Signed)
Patient in room laughing and joking with staff and watching tv. Pt hyperactive and needing frequent redirection from staff to remain in his room and keep his scrub top on. Sitter at bedside for safety.

## 2017-08-31 MED ORDER — MONTELUKAST SODIUM 5 MG PO CHEW: 5.0000 mg | CHEWABLE_TABLET | ORAL | 0 refills | Status: DC

## 2017-08-31 MED ORDER — PANTOPRAZOLE SODIUM 40 MG PO TBEC: 40.0000 mg | DELAYED_RELEASE_TABLET | Freq: Every day | ORAL | 0 refills | Status: DC

## 2017-08-31 MED FILL — PANTOPRAZOLE SOD DR 40 MG T: 40 | 30 days supply | Qty: 30 | Fill #0

## 2017-08-31 MED FILL — MONTELUKAST SOD 5 MG TAB CH: 5 | 30 days supply | Qty: 30 | Fill #0

## 2017-08-31 MED FILL — CLINDAMYCIN PHOS-BENZOYL PE: 1-5 | 30 days supply | Qty: 25 | Fill #0

## 2017-08-31 MED FILL — PROAIR HFA 90 MCG INHALER: 108 (90 BAS | 25 days supply | Qty: 9 | Fill #0

## 2017-08-31 NOTE — Progress Notes (Signed)
DC given and signed by pt's legal guardian, mother Kahlil Cowans. Pt will be transported via [facility] to crisis facility

## 2017-08-31 NOTE — ED Provider Notes (Signed)
Patient remains hemodynamically stable.  He is been accepted to an outpatient group home. Psychiatrically cleared. His vital signs remained stable.  No new changes in medications.   Shaune Pollack, MD 08/31/17 1118

## 2017-08-31 NOTE — Progress Notes (Addendum)
12:30PM: Patient discharged from facility via [facility] to [facility] crisis [facility].   11:30AM: Patients mother arrived to pick up AVS and scripts. Patient still on unit waiting for [facility] transportation.   [facility] has been scheduled for transportation to [facility] crisis center for 12:30PM- address: [address] [place]. Patients mother will arrive to hospital to pick up AVS and any scripts at 11:30AM. Patients mother to complete admission paperwork/ fill any new scripts before patient to arrive to crisis center.   Stacy Gardner, [facility] Emergency Room Clinical Social Worker [phone]

## 2017-10-15 ENCOUNTER — Encounter

## 2022-04-29 ENCOUNTER — Ambulatory Visit: Payer: Medicaid Other

## 2022-04-30 ENCOUNTER — Ambulatory Visit
Admission: EM | Admit: 2022-04-30 | Discharge: 2022-04-30 | Disposition: A | Discharge status: Home / Self Care | Payer: 59 | Attending: Emergency Medicine | Admitting: Emergency Medicine

## 2022-04-30 ENCOUNTER — Other Ambulatory Visit: Payer: Self-pay

## 2022-04-30 ENCOUNTER — Encounter: Payer: Self-pay | Admitting: Emergency Medicine

## 2022-04-30 DIAGNOSIS — Z711 Person with feared health complaint in whom no diagnosis is made: Secondary | ICD-10-CM

## 2022-04-30 DIAGNOSIS — R319 Hematuria, unspecified: Secondary | ICD-10-CM

## 2022-04-30 LAB — POCT URINALYSIS DIP (MANUAL ENTRY)
Bilirubin, UA: NEGATIVE
Blood, UA: NEGATIVE
Glucose, UA: NEGATIVE mg/dL
Ketones, POC UA: NEGATIVE mg/dL
Leukocytes, UA: NEGATIVE
Nitrite, UA: NEGATIVE
Protein Ur, POC: NEGATIVE mg/dL
Spec Grav, UA: 1.025 (ref 1.010–1.025)
Urobilinogen, UA: 0.2 E.U./dL
pH, UA: 5.5 (ref 5.0–8.0)

## 2022-04-30 NOTE — ED Provider Notes (Signed)
[facility]    CSN: [id number] Arrival date & time: 04/30/22  1300      History   Chief Complaint Chief Complaint  Patient presents with   Hematuria    HPI Maurice [name] is a 27 y.o. male.   Patient presents today for reevaluation of an episode of hematuria that occurred approximately 3 months ago.  Reports that in September 2023 he had 1 day where he had blood streaks in his urine.  He then push fluids and this resolved without intervention.  He denies any associated symptoms including flank pain, abdominal pain, fever, nausea, vomiting.  He denies history of nephrolithiasis.  Denies any previous or additional symptoms since that time of hematuria.  He has not been sexually active in many years and has no concern for STI.  He does not have personal or family history of bleeding disorder.  He does not take any blood thinning medications.  He reports that he is currently asymptomatic and has not had any additional episodes recently.    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 No pertinent surgical history.     Home Medications    Prior to Admission medications   Medication Sig Start Date End Date Taking? Authorizing Provider  adapalene (DIFFERIN) 0.1 % gel Apply topically at bedtime. 08/26/17   Mesner, Barbara Cower, MD  adapalene (DIFFERIN) 0.1 % gel Apply 1 application topically at bedtime.    [provider]  albuterol (PROAIR HFA) 108 (90 Base) MCG/ACT inhaler Inhale 2 puffs into the lungs every 6 (six) hours as needed for wheezing or shortness of breath. 08/26/17   Mesner, Barbara Cower, MD  clindamycin-benzoyl peroxide (BENZACLIN) gel Apply topically 2 (two) times daily. 08/26/17   Mesner, Barbara Cower, MD  clindamycin-benzoyl peroxide (BENZACLIN) gel Apply 1 application topically 2 (two) times daily.    [provider]  cloNIDine (CATAPRES) 0.1 MG tablet Take 0.5 tablets (0.05 mg total) by mouth 2 (two) times daily as needed (agitation). 08/13/17   Armandina Stammer I, NP  fluticasone (FLONASE) 50 MCG/ACT nasal spray Place 2 sprays into both nostrils daily. For allergies 08/14/17   Armandina Stammer I, NP  fluticasone (FLOVENT HFA) 220 MCG/ACT inhaler Inhale 2 puffs into the lungs daily. For asthma 08/26/17   Mesner, Barbara Cower, MD  gabapentin (NEURONTIN) 300 MG capsule Take 2 capsules (600 mg total) by mouth 3 (three) times daily. For agitation 08/13/17   Sanjuana Kava,  NP  montelukast (SINGULAIR) 5 MG chewable tablet Chew 1 tablet (5 mg total) by mouth every morning. For Asthma 08/31/17 09/30/17  Duffy Bruce, MD  pantoprazole (PROTONIX) 40 MG tablet Take 1 tablet (40 mg total) by mouth daily. For acid reflux 08/31/17 09/30/17  Duffy Bruce, MD  propranolol (INDERAL) 40 MG tablet Take 1 tablet (40 mg total) by mouth 3 (three) times daily. For anxiety 08/13/17   Lindell Spar I, NP  traZODone  (DESYREL) 50 MG tablet Take 1 tablet (50 mg total) by mouth at bedtime as needed for sleep. 08/13/17   Lindell Spar I, NP  ziprasidone (GEODON) 80 MG capsule Take 1 capsule (80 mg total) by mouth 2 (two) times daily with a meal. For mood control 08/13/17   Encarnacion Slates, NP    Family History Family History  Adopted: Yes    Social History Social History   Tobacco Use   Smoking status: Never   Smokeless tobacco: Never  Vaping Use   Vaping Use: Never used  Substance Use Topics   Alcohol use: Yes    Comment: I mix henessy with gatorade   Drug use: Yes    Types: Marijuana     Allergies   Patient has no known allergies.   Review of Systems Review of Systems  Constitutional:  Negative for activity change, appetite change, fatigue and fever.  Gastrointestinal:  Negative for abdominal pain, diarrhea, nausea and vomiting.  Genitourinary:  Negative for dysuria, frequency, hematuria (Resolved), penile discharge, penile pain and urgency.  Musculoskeletal:  Negative for arthralgias and myalgias.  Skin:  Negative for rash.     Physical Exam Triage Vital Signs ED Triage Vitals [04/30/22 1449]  Enc Vitals Group     BP (!) 146/84     Pulse Rate 80     Resp 19     Temp 97.9 F (36.6 C)     Temp Source Oral     SpO2 97 %     Weight 147 lb 14.9 oz (67.1 kg)     Height 5\' 10"  (1.778 m)     Head Circumference      Peak Flow      Pain Score 0     Pain Loc      Pain Edu?      Excl. in [place]?    No data found.  Updated Vital Signs BP (!) 146/84 (BP Location: Right Arm)   Pulse 80   Temp 97.9 F (36.6 C) (Oral)   Resp 19   Ht 5\' 10"  (1.778 m)   Wt 147 lb 14.9 oz (67.1 kg)   SpO2 97%   BMI 21.23 kg/m   Visual Acuity Right Eye Distance:   Left Eye Distance:   Bilateral Distance:    Right Eye Near:   Left Eye Near:    Bilateral Near:     Physical Exam Vitals reviewed.  Constitutional:      General: He is awake.     Appearance: Normal appearance. He is  well-developed. He is not ill-appearing.     Comments: Very pleasant male appears stated age in no acute distress sitting comfortably in exam room  HENT:     Head: Normocephalic and atraumatic.     Mouth/Throat:     Pharynx: Uvula midline. No oropharyngeal exudate or posterior oropharyngeal erythema.  Cardiovascular:     Rate and Rhythm: Normal rate and regular rhythm.     Heart sounds: Normal heart sounds, S1 normal and S2 normal.  No murmur heard. Pulmonary:     Effort: Pulmonary effort is normal.     Breath sounds: Normal breath sounds. No stridor. No wheezing, rhonchi or rales.     Comments: Clear to auscultation bilaterally Abdominal:     General: Bowel sounds are normal.     Palpations: Abdomen is soft.     Tenderness: There is no abdominal tenderness. There is no right CVA tenderness, left CVA tenderness, guarding or rebound.  Neurological:     Mental Status: He is alert.  Psychiatric:        Behavior: Behavior is cooperative.      UC Treatments / Results  Labs (all labs ordered are listed, but only abnormal results are displayed) Labs Reviewed  POCT URINALYSIS DIP (MANUAL ENTRY)    EKG   Radiology No results found.  Procedures Procedures (including critical care time)  Medications Ordered in UC Medications - No data to display  Initial Impression / Assessment and Plan / UC Course  I have reviewed the triage vital signs and the nursing notes.  Pertinent labs & imaging results that were available during my care of the patient were reviewed by me and considered in my medical decision making (see chart for details).     Patient is well-appearing, afebrile, nontoxic, nontachycardic.  UA in clinic is normal with not even microscopic hematuria.  Unclear etiology of his episode of symptoms.  He was encouraged to push fluids.  Discussed that if he has recurrent symptoms he should follow-up with urology was given contact information for local provider.  If he develops  any additional symptoms or if anything worsens he needs to be seen immediately to which she expressed understanding.  Final Clinical Impressions(s) / UC Diagnoses   Final diagnoses:  Worried well  Hematuria, unspecified type     Discharge Instructions      His urine is normal in clinic today.  If this happens again he should be reevaluated by specialist (urologist).  Please call them to schedule an appointment if needed.  If he has recurrent episode he should return here at the time of symptoms for reevaluation.     ED Prescriptions   None    PDMP not reviewed this encounter.   Terrilee Croak, PA-C 04/30/22 1622

## 2022-04-30 NOTE — ED Triage Notes (Signed)
Pt states he wants to be check for blood on his urine that happened on 12/2021. Pt denies any actual urinary symptoms at this time.

## 2022-04-30 NOTE — Discharge Instructions (Signed)
His urine is normal in clinic today.  If this happens again he should be reevaluated by specialist (urologist).  Please call them to schedule an appointment if needed.  If he has recurrent episode he should return here at the time of symptoms for reevaluation.

## 2022-07-15 ENCOUNTER — Emergency Department (HOSPITAL_COMMUNITY): Payer: 59

## 2022-07-15 ENCOUNTER — Emergency Department (HOSPITAL_COMMUNITY)
Admission: EM | Admit: 2022-07-15 | Discharge: 2022-07-15 | Disposition: A | Discharge status: Home / Self Care | Payer: 59 | Attending: Emergency Medicine | Admitting: Emergency Medicine

## 2022-07-15 ENCOUNTER — Other Ambulatory Visit: Payer: Self-pay

## 2022-07-15 DIAGNOSIS — J45909 Unspecified asthma, uncomplicated: Secondary | ICD-10-CM | POA: Diagnosis not present

## 2022-07-15 DIAGNOSIS — R519 Headache, unspecified: Secondary | ICD-10-CM

## 2022-07-15 DIAGNOSIS — W500XXA Accidental hit or strike by another person, initial encounter: Secondary | ICD-10-CM | POA: Diagnosis not present

## 2022-07-15 DIAGNOSIS — M545 Low back pain, unspecified: Secondary | ICD-10-CM | POA: Diagnosis not present

## 2022-07-15 DIAGNOSIS — W19XXXA Unspecified fall, initial encounter: Secondary | ICD-10-CM

## 2022-07-15 NOTE — ED Notes (Signed)
Patient verbalizes understanding of discharge instructions. Opportunity for questioning and answers were provided. Armband removed by staff, pt discharged from ED. Pt discharged in police custody.
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
## 2022-07-15 NOTE — Discharge Instructions (Addendum)
Tylenol as needed directed.  Recheck with your doctor as needed.
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
## 2022-08-12 ENCOUNTER — Encounter (HOSPITAL_COMMUNITY): Payer: Self-pay

## 2022-08-12 ENCOUNTER — Other Ambulatory Visit: Payer: Self-pay

## 2022-08-12 ENCOUNTER — Emergency Department (HOSPITAL_COMMUNITY)
Admission: EM | Admit: 2022-08-12 | Discharge: 2022-09-10 | Disposition: A | Discharge status: Home / Self Care | Payer: 59 | Source: Home / Self Care | Attending: Emergency Medicine | Admitting: Emergency Medicine

## 2022-08-12 ENCOUNTER — Emergency Department (HOSPITAL_COMMUNITY)
Admission: EM | Admit: 2022-08-12 | Discharge: 2022-08-12 | Disposition: A | Discharge status: Home / Self Care | Payer: 59 | Attending: Emergency Medicine | Admitting: Emergency Medicine

## 2022-08-12 DIAGNOSIS — Z62833 Group home staff-child conflict: Secondary | ICD-10-CM | POA: Insufficient documentation

## 2022-08-12 DIAGNOSIS — R109 Unspecified abdominal pain: Secondary | ICD-10-CM | POA: Diagnosis not present

## 2022-08-12 DIAGNOSIS — Z79899 Other long term (current) drug therapy: Secondary | ICD-10-CM | POA: Diagnosis not present

## 2022-08-12 DIAGNOSIS — R454 Irritability and anger: Secondary | ICD-10-CM | POA: Insufficient documentation

## 2022-08-12 LAB — COMPREHENSIVE METABOLIC PANEL
ALT: 36 U/L (ref 0–44)
AST: 30 U/L (ref 15–41)
Albumin: 4.1 g/dL (ref 3.5–5.0)
Alkaline Phosphatase: 80 U/L (ref 38–126)
Anion gap: 11 (ref 5–15)
BUN: 13 mg/dL (ref 6–20)
CO2: 25 mmol/L (ref 22–32)
Calcium: 9.4 mg/dL (ref 8.9–10.3)
Chloride: 102 mmol/L (ref 98–111)
Creatinine, Ser: 1.6 mg/dL — ABNORMAL HIGH (ref 0.61–1.24)
GFR, Estimated: >60 mL/min (ref >60)
Glucose, Bld: 98 mg/dL (ref 70–99)
Potassium: 4.1 mmol/L (ref 3.5–5.1)
Sodium: 138 mmol/L (ref 135–145)
Total Bilirubin: 0.8 mg/dL (ref 0.3–1.2)
Total Protein: 7.4 g/dL (ref 6.5–8.1)

## 2022-08-12 LAB — CBC
HCT: 48.7 % (ref 39.0–52.0)
Hemoglobin: 16.1 g/dL (ref 13.0–17.0)
MCH: 27.4 pg (ref 26.0–34.0)
MCHC: 33.1 g/dL (ref 30.0–36.0)
MCV: 82.8 fL (ref 80.0–100.0)
Platelets: 205 10*3/uL (ref 150–400)
RBC: 5.88 MIL/uL — ABNORMAL HIGH (ref 4.22–5.81)
RDW: 14.1 % (ref 11.5–15.5)
WBC: 7 10*3/uL (ref 4.0–10.5)
nRBC: 0 % (ref 0.0–0.2)

## 2022-08-12 LAB — RAPID URINE DRUG SCREEN, HOSP PERFORMED
Amphetamines: POSITIVE — AB
Barbiturates: NOT DETECTED
Benzodiazepines: NOT DETECTED
Cocaine: NOT DETECTED
Opiates: NOT DETECTED
Tetrahydrocannabinol: POSITIVE — AB

## 2022-08-12 LAB — SALICYLATE LEVEL: Salicylate Lvl: <7 mg/dL — ABNORMAL LOW (ref 7.0–30.0)

## 2022-08-12 LAB — ACETAMINOPHEN LEVEL: Acetaminophen (Tylenol), Serum: <10 ug/mL — ABNORMAL LOW (ref 10–30)

## 2022-08-12 LAB — ETHANOL: Alcohol, Ethyl (B): <10 mg/dL (ref <10)

## 2022-08-12 MED ORDER — LISDEXAMFETAMINE DIMESYLATE 30 MG PO CAPS
30.0000 mg | ORAL_CAPSULE | Freq: Every day | ORAL | Status: DC
  Administered 2022-08-13 – 2022-09-10 (×29): 30 mg via ORAL
  Filled 2022-08-12 (×30): qty 1

## 2022-08-12 MED ORDER — CLONIDINE HCL 0.1 MG PO TABS
0.0500 mg | ORAL_TABLET | Freq: Two times a day (BID) | ORAL | Status: DC | PRN
  Administered 2022-08-21: 0.05 mg via ORAL
  Filled 2022-08-12 (×2): qty 1

## 2022-08-12 MED ORDER — FLUTICASONE PROPIONATE 50 MCG/ACT NA SUSP: 2.0000 | Freq: Every day | NASAL | Status: DC

## 2022-08-12 MED ORDER — ZOLPIDEM TARTRATE 5 MG PO TABS: 5.0000 mg | ORAL_TABLET | Freq: Every evening | ORAL | Status: DC | PRN

## 2022-08-12 MED ORDER — LISDEXAMFETAMINE DIMESYLATE 20 MG PO CAPS
20.0000 mg | ORAL_CAPSULE | Freq: Every day | ORAL | Status: DC
  Administered 2022-08-13 – 2022-09-10 (×29): 20 mg via ORAL
  Filled 2022-08-12 (×30): qty 1

## 2022-08-12 MED ORDER — ACETAMINOPHEN 325 MG PO TABS
650.0000 mg | ORAL_TABLET | ORAL | Status: DC | PRN
  Administered 2022-08-30: 650 mg via ORAL
  Filled 2022-08-12: qty 2

## 2022-08-12 MED ORDER — CHLORPROMAZINE HCL 100 MG PO TABS
100.0000 mg | ORAL_TABLET | Freq: Three times a day (TID) | ORAL | Status: DC
  Administered 2022-08-12 – 2022-09-10 (×79): 100 mg via ORAL
  Filled 2022-08-12 (×3): qty 4
  Filled 2022-08-12 (×2): qty 1
  Filled 2022-08-12 (×3): qty 4
  Filled 2022-08-12 (×2): qty 1
  Filled 2022-08-12 (×2): qty 4
  Filled 2022-08-12: qty 1
  Filled 2022-08-12 (×4): qty 4
  Filled 2022-08-12 (×2): qty 1
  Filled 2022-08-12: qty 4
  Filled 2022-08-12: qty 1
  Filled 2022-08-12: qty 4
  Filled 2022-08-12: qty 1
  Filled 2022-08-12: qty 4
  Filled 2022-08-12 (×2): qty 1
  Filled 2022-08-12 (×4): qty 4
  Filled 2022-08-12 (×2): qty 1
  Filled 2022-08-12 (×3): qty 4
  Filled 2022-08-12: qty 1
  Filled 2022-08-12: qty 4
  Filled 2022-08-12 (×2): qty 1
  Filled 2022-08-12 (×3): qty 4
  Filled 2022-08-12: qty 1
  Filled 2022-08-12: qty 4
  Filled 2022-08-12: qty 1
  Filled 2022-08-12 (×7): qty 4
  Filled 2022-08-12: qty 1
  Filled 2022-08-12 (×7): qty 4
  Filled 2022-08-12: qty 1
  Filled 2022-08-12 (×2): qty 4
  Filled 2022-08-12: qty 1
  Filled 2022-08-12 (×2): qty 4
  Filled 2022-08-12: qty 1
  Filled 2022-08-12 (×3): qty 4
  Filled 2022-08-12: qty 1
  Filled 2022-08-12: qty 4
  Filled 2022-08-12: qty 1
  Filled 2022-08-12 (×3): qty 4
  Filled 2022-08-12 (×2): qty 1
  Filled 2022-08-12 (×11): qty 4
  Filled 2022-08-12: qty 1
  Filled 2022-08-12: qty 4

## 2022-08-12 MED ORDER — HALOPERIDOL 5 MG PO TABS
5.0000 mg | ORAL_TABLET | Freq: Two times a day (BID) | ORAL | Status: DC
  Administered 2022-08-12 – 2022-09-10 (×58): 5 mg via ORAL
  Filled 2022-08-12 (×58): qty 1

## 2022-08-12 MED ORDER — CLONIDINE HCL 0.1 MG PO TABS: 0.0500 mg | ORAL_TABLET | Freq: Two times a day (BID) | ORAL | Status: DC | PRN

## 2022-08-12 MED ORDER — OLANZAPINE 5 MG PO TBDP
15.0000 mg | ORAL_TABLET | Freq: Every morning | ORAL | Status: DC
  Administered 2022-08-13 – 2022-09-10 (×29): 15 mg via ORAL
  Filled 2022-08-12 (×29): qty 3

## 2022-08-12 MED ORDER — OLANZAPINE 5 MG PO TABS
5.0000 mg | ORAL_TABLET | Freq: Every day | ORAL | Status: DC
  Administered 2022-08-12 – 2022-09-09 (×29): 5 mg via ORAL
  Filled 2022-08-12 (×29): qty 1

## 2022-08-12 MED ORDER — PROPRANOLOL HCL 10 MG PO TABS
40.0000 mg | ORAL_TABLET | Freq: Three times a day (TID) | ORAL | Status: DC
  Administered 2022-08-12: 40 mg via ORAL
  Filled 2022-08-12: qty 4

## 2022-08-12 MED ORDER — TRAZODONE HCL 50 MG PO TABS: 50.0000 mg | ORAL_TABLET | Freq: Every evening | ORAL | Status: DC | PRN

## 2022-08-12 MED ORDER — PANTOPRAZOLE SODIUM 40 MG PO TBEC
40.0000 mg | DELAYED_RELEASE_TABLET | Freq: Every day | ORAL | Status: DC
  Administered 2022-08-12 – 2022-09-10 (×30): 40 mg via ORAL
  Filled 2022-08-12 (×30): qty 1

## 2022-08-12 MED ORDER — MONTELUKAST SODIUM 10 MG PO TABS
10.0000 mg | ORAL_TABLET | Freq: Every day | ORAL | Status: DC
  Administered 2022-08-12 – 2022-09-09 (×29): 10 mg via ORAL
  Filled 2022-08-12 (×30): qty 1

## 2022-08-12 MED ORDER — ONDANSETRON HCL 4 MG PO TABS: 4.0000 mg | ORAL_TABLET | Freq: Three times a day (TID) | ORAL | Status: DC | PRN

## 2022-08-12 MED ORDER — GABAPENTIN 300 MG PO CAPS
600.0000 mg | ORAL_CAPSULE | Freq: Three times a day (TID) | ORAL | Status: DC
  Administered 2022-08-12: 600 mg via ORAL
  Filled 2022-08-12: qty 2

## 2022-08-12 MED ORDER — TRAZODONE HCL 50 MG PO TABS: 100.0000 mg | ORAL_TABLET | Freq: Every evening | ORAL | Status: DC | PRN

## 2022-08-12 MED ORDER — LISDEXAMFETAMINE DIMESYLATE 50 MG PO CAPS: 50.0000 mg | ORAL_CAPSULE | Freq: Every morning | ORAL | Status: DC

## 2022-08-12 MED ORDER — GABAPENTIN 300 MG PO CAPS
600.0000 mg | ORAL_CAPSULE | Freq: Three times a day (TID) | ORAL | Status: DC
  Administered 2022-08-12 – 2022-09-10 (×82): 600 mg via ORAL
  Filled 2022-08-12 (×83): qty 2

## 2022-08-12 MED ORDER — ZIPRASIDONE HCL 80 MG PO CAPS: 80.0000 mg | ORAL_CAPSULE | Freq: Two times a day (BID) | ORAL | Status: DC

## 2022-08-12 NOTE — Progress Notes (Addendum)
TOC CSW has spoken with [employer], who is in agreeance to take pt back to group home.  Deputy Seward Meth stated once the pt is return to group home, he will no longer be under his custody and custody will be resumed by group home.  CSW will contact pts guardian, Arvid Right.  Ahmaya Ostermiller Tarpley-Carter, MSW, LCSW-A Pronouns:  She/Her/Hers [facility] Health[facility] of Care Clinical Social Worker Direct Number:  [phone] [email or web address]

## 2022-08-12 NOTE — Progress Notes (Signed)
CSW contacted Alla German at [phone] from patients group home. CSW was told that they tried to work with patient but believe he needs a high lever of care. CSW was told a discharge notice was sent to patients care coordinator and guardian 4 weeks ago. CSW was told patient has a new guardian with the [employer] but he was unsure of her number Arvid Right). CSW was told they cannot take patient back.

## 2022-08-12 NOTE — Progress Notes (Addendum)
TOC CSW notified legal guardian, Arvid Right [phone] of TOC leaderships decision about dc plan.  Alexa is in agreeance with TOC leaderships decision.  Alexa stated she would inform the group home via email.  CSW will keep Alexa updated of pts dc.  Slayden Mennenga Tarpley-Carter, MSW, LCSW-A Pronouns:  She/Her/Hers [facility] Health[facility] of Care Clinical Social Worker Direct Number:  [phone] [email or web address]

## 2022-08-12 NOTE — ED Notes (Signed)
Pt refused meal. 

## 2022-08-12 NOTE — Progress Notes (Signed)
CSW contacted patients care coordinators supervisor Danetta Christmas with [employer] [phone]. CSW left an voicemail requesting a call back.

## 2022-08-12 NOTE — Progress Notes (Signed)
CSW emailed both [address] [address] [facility] and  [place] Christmas from [facility]. CSW was told the case will be staffed and they will contact CSW back.

## 2022-08-12 NOTE — Progress Notes (Signed)
TOC leadership made aware of patient boarding in the ED.

## 2022-08-12 NOTE — Progress Notes (Signed)
TOC CSW has confirmed the dc plan per [facility] leadership that pt is to be returned to group home ([address] [place], [place] [address]) by [employer].  By the group home refusing to accept pt back with a final dc date of 09/23/2022, they are not in compliance with the terms between the group home and legal guardian or by law.  Also, in compliance with the law the group home is to be actively seeking placement.  As stated in this CSW's previous note;  Arvid Right, pts legal guardian is aware of this discharge plan and has agreed to the plan.  Alexa did state to group home yesterday that they are felling to comply with the law by executing the dc before 09/23/2022.  Alexa stated that she was notified of the discharge two weeks ago when she met with the group home.  CSW is currently awaiting [employer]'s arrival for discharge.  Kaycee Mcgaugh Tarpley-Carter, MSW, LCSW-A Pronouns:  She/Her/Hers [facility] Health[facility] of Care Clinical Social Worker Direct Number:  [phone] [email or web address]

## 2022-08-12 NOTE — ED Notes (Signed)
Pt report received from previous nurse. Pt A&O x4, vitals stable, denies needs/complaints. Call bell in reach. No acute distress noted.  

## 2022-08-12 NOTE — ED Provider Notes (Signed)
[facility]ERGENCY DE[facility]MENT AT [facility] Provider Note   CSN: [id number] Arrival date & time: 08/12/22  2055     History  Chief Complaint  Patient presents with   Suicidal    Maurice [name] is a 27 y.o. male.  HPI    27 year old male brought into the emergency room by PD.  Patient was just discharged from the hospital, when he got to the group home there was nobody available to allow patient in, and the Police Department returned with him.  Patient was involuntary committed by group home and it appears that there was a conflict at the time of discharge.  Group home did not want to receive the patient.  Pt has no complains. States that he has been in the group home for the last 4 months.   Home Medications Prior to Admission medications   Medication Sig Start Date End Date Taking? Authorizing Provider  adapalene (DIFFERIN) 0.1 % gel Apply topically at bedtime. Patient not taking: Reported on 08/12/2022 08/26/17   Mesner, Barbara Cower, MD  Adapalene 0.3 % gel Apply 1 Application topically in the morning and at bedtime. 08/11/22   [provider]  albuterol (PROAIR HFA) 108 (90 Base) MCG/ACT inhaler Inhale 2 puffs into the lungs every 6 (six) hours as needed for wheezing or shortness of breath. 08/26/17   Mesner, Barbara Cower, MD  chlorproMAZINE (THORAZINE) 100 MG tablet Take 100 mg by mouth 3 (three) times daily. 08/08/22   [provider]  clindamycin-benzoyl peroxide (BENZACLIN) gel Apply topically 2 (two) times daily. Patient not taking: Reported on 08/12/2022 08/26/17   Mesner, Barbara Cower, MD  cloNIDine (CATAPRES) 0.1 MG tablet Take 0.5 tablets (0.05 mg total) by mouth 2 (two) times daily as needed (agitation). 08/13/17   Armandina Stammer I, NP  fluticasone (FLONASE) 50 MCG/ACT nasal spray Place 2 sprays into both nostrils daily. For allergies Patient not taking: Reported on 08/12/2022 08/14/17   Armandina Stammer I, NP  fluticasone (FLOVENT HFA) 220 MCG/ACT inhaler Inhale 2 puffs  into the lungs daily. For asthma Patient not taking: Reported on 08/12/2022 08/26/17   Mesner, Barbara Cower, MD  gabapentin (NEURONTIN) 300 MG capsule Take 2 capsules (600 mg total) by mouth 3 (three) times daily. For agitation 08/13/17   Armandina Stammer I, NP  haloperidol (HALDOL) 5 MG tablet Take 5 mg by mouth 2 (two) times daily. 08/11/22   [provider]  montelukast (SINGULAIR) 10 MG tablet Take 10 mg by mouth at bedtime.    [provider]  montelukast (SINGULAIR) 5 MG chewable tablet Chew 1 tablet (5 mg total) by mouth every morning. For Asthma Patient not taking: Reported on 08/12/2022 08/31/17 09/30/17  Shaune Pollack, MD  OLANZapine (ZYPREXA) 5 MG tablet Take 5 mg by mouth at bedtime. 08/11/22   [provider]  OLANZapine zydis (ZYPREXA) 15 MG disintegrating tablet Take 15 mg by mouth in the morning. 08/11/22   [provider]  pantoprazole (PROTONIX) 40 MG tablet Take 1 tablet (40 mg total) by mouth daily. For acid reflux 08/31/17 08/12/22  Shaune Pollack, MD  propranolol (INDERAL) 40 MG tablet Take 1 tablet (40 mg total) by mouth 3 (three) times daily. For anxiety Patient not taking: Reported on 08/12/2022 08/13/17   Armandina Stammer I, NP  traZODone (DESYREL) 100 MG tablet Take 100-200 mg by mouth at bedtime.    [provider]  traZODone (DESYREL) 50 MG tablet Take 1 tablet (50 mg total) by mouth at bedtime as needed for  sleep. Patient not taking: Reported on 08/12/2022 08/13/17   Armandina Stammer I, NP  VYVANSE 50 MG capsule Take 50 mg by mouth every morning. 07/15/22   [provider]  ziprasidone (GEODON) 80 MG capsule Take 1 capsule (80 mg total) by mouth 2 (two) times daily with a meal. For mood control Patient not taking: Reported on 08/12/2022 08/13/17   Sanjuana Kava, NP      Allergies    Patient has no known allergies.    Review of Systems   Review of Systems  All other systems reviewed and are negative.   Physical Exam Updated Vital Signs BP  130/88 (BP Location: Right Arm)   Pulse 78   Temp 98.6 F (37 C) (Oral)   Resp 18   Ht 5\' 11"  (1.803 m)   SpO2 99%   BMI 20.63 kg/m  Physical Exam Vitals and nursing note reviewed.  Constitutional:      Appearance: He is well-developed.  HENT:     Head: Atraumatic.  Cardiovascular:     Rate and Rhythm: Normal rate.  Pulmonary:     Effort: Pulmonary effort is normal.  Musculoskeletal:     Cervical back: Neck supple.  Skin:    General: Skin is warm.  Neurological:     Mental Status: He is alert and oriented to person, place, and time.    ED Results / Procedures / Treatments   Labs (all labs ordered are listed, but only abnormal results are displayed) Labs Reviewed  COMPREHENSIVE METABOLIC PANEL - Abnormal; Notable for the following components:      Result Value   Creatinine, Ser 1.60 (*)    All other components within normal limits  SALICYLATE LEVEL - Abnormal; Notable for the following components:   Salicylate Lvl <7.0 (*)    All other components within normal limits  ACETAMINOPHEN LEVEL - Abnormal; Notable for the following components:   Acetaminophen (Tylenol), Serum <10 (*)    All other components within normal limits  CBC - Abnormal; Notable for the following components:   RBC 5.88 (*)    All other components within normal limits  RAPID URINE DRUG SCREEN, HOSP PERFORMED - Abnormal; Notable for the following components:   Amphetamines POSITIVE (*)    Tetrahydrocannabinol POSITIVE (*)    All other components within normal limits  ETHANOL    EKG EKG Interpretation  Date/Time:  Tuesday August 12 2022 20:54:35 EDT Ventricular Rate:  81 PR Interval:  112 QRS Duration: 76 QT Interval:  352 QTC Calculation: 408 R Axis:   39 Text Interpretation: Normal sinus rhythm Normal ECG When compared with ECG of 07-Aug-2017 17:16, PREVIOUS ECG IS PRESENT Confirmed by Derwood Kaplan [phone]) on 08/12/2022 10:39:56 PM  Radiology No results found.  Procedures Procedures     Medications Ordered in ED Medications  acetaminophen (TYLENOL) tablet 650 mg (has no administration in time range)  zolpidem (AMBIEN) tablet 5 mg (has no administration in time range)  ondansetron (ZOFRAN) tablet 4 mg (has no administration in time range)  chlorproMAZINE (THORAZINE) tablet 100 mg (has no administration in time range)  cloNIDine (CATAPRES) tablet 0.05 mg (has no administration in time range)  haloperidol (HALDOL) tablet 5 mg (has no administration in time range)  gabapentin (NEURONTIN) capsule 600 mg (has no administration in time range)  montelukast (SINGULAIR) tablet 10 mg (has no administration in time range)  OLANZapine (ZYPREXA) tablet 5 mg (has no administration in time range)  OLANZapine zydis (ZYPREXA) disintegrating tablet 15 mg (  has no administration in time range)  pantoprazole (PROTONIX) EC tablet 40 mg (has no administration in time range)  lisdexamfetamine (VYVANSE) capsule 50 mg (has no administration in time range)    ED Course/ Medical Decision Making/ A&P                             Medical Decision Making Amount and/or Complexity of Data Reviewed Labs: ordered.  Risk OTC drugs. Prescription drug management.  27 year old patient comes in with chief complaint of abdominal pain.  Patient was discharged from the ER.  It appears that upon arrival to the group home with PD, nobody was there to except him.  Patient returned to the emergency room.  He has no SI, HI. We will put in [facility] consult and home meds. No labs indicated.    EKG has been ordered given that he is on multiple QT prolonging medications.   Final Clinical Impression(s) / ED Diagnoses Final diagnoses:  Group home staff-child conflict    Rx / DC Orders ED Discharge Orders     None         Derwood Kaplan, MD 08/12/22 2240

## 2022-08-12 NOTE — ED Triage Notes (Signed)
Patient BIB [facility] with IVC paperwork.   Patient was making SI statements to the manager of group home.  SEE IVC paperwork  Patient denies SI/HI thoughts or attempts in triage.

## 2022-08-12 NOTE — Progress Notes (Signed)
CSW contacted patients care coordinator [name] with [employer] [phone]. CSW left an voicemail requesting a call back.

## 2022-08-12 NOTE — Progress Notes (Signed)
TOC CSW was contacted by Lorina Rabon, [employer] that he would be contacting the state to report the facility for refusing to pick up pt.  Deputy Particia Nearing stated they are receiving a lot of these calls and they've been reporting the group homes to the state.  Martavius Lusty Tarpley-Carter, MSW, LCSW-A Pronouns:  She/Her/Hers [facility] Clinical Social Worker Direct Number:  [phone] [email or web address]

## 2022-08-12 NOTE — ED Notes (Signed)
Care coordinator [name]- [phone]

## 2022-08-12 NOTE — ED Triage Notes (Signed)
Pt arrived in custody from group home.  Pt was IVCd for expressing verbal wish to end his life.  Pt denies such at this time. States he "was just upset" and does not want to harm himself, "I just want to go to bed"

## 2022-08-12 NOTE — Progress Notes (Signed)
CSW received a call from legal guardian Theda Belfast who stated that she has no where for patient to go at this time. Alexa stated she will staff with her supervisor on how to make the group home accountable as they had until the end of May 2024 to keep patient in their home.

## 2022-08-12 NOTE — ED Provider Notes (Addendum)
EMERGENCY DEPARTMENT AT [facility] Provider Note   CSN: [id number] Arrival date & time: 08/12/22  0011     History  Chief complaint: Involuntary commitment  Maurice [name] is a 27 y.o. male.  The history is provided by the patient.  He was brought in under involuntary commitment because of suicidal ideation.  He states that he had an argument with his friend and got angry and thought of cutting himself.  However, within an hour, his angers went away and he no longer feels that he wants to hurt himself.  He denies alcohol or drug use.  He denies hallucinations.  He denies crying spells, early morning wakening, anhedonia.   Home Medications Prior to Admission medications   Medication Sig Start Date End Date Taking? Authorizing Provider  adapalene (DIFFERIN) 0.1 % gel Apply topically at bedtime. 08/26/17   Mesner, Barbara Cower, MD  adapalene (DIFFERIN) 0.1 % gel Apply 1 application topically at bedtime.    [provider]  albuterol (PROAIR HFA) 108 (90 Base) MCG/ACT inhaler Inhale 2 puffs into the lungs every 6 (six) hours as needed for wheezing or shortness of breath. 08/26/17   Mesner, Barbara Cower, MD  clindamycin-benzoyl peroxide (BENZACLIN) gel Apply topically 2 (two) times daily. 08/26/17   Mesner, Barbara Cower, MD  clindamycin-benzoyl peroxide (BENZACLIN) gel Apply 1 application topically 2 (two) times daily.    [provider]  cloNIDine (CATAPRES) 0.1 MG tablet Take 0.5 tablets (0.05 mg total) by mouth 2 (two) times daily as needed (agitation). 08/13/17   Armandina Stammer I, NP  fluticasone (FLONASE) 50 MCG/ACT nasal spray Place 2 sprays into both nostrils daily. For allergies 08/14/17   Armandina Stammer I, NP  fluticasone (FLOVENT HFA) 220 MCG/ACT inhaler Inhale 2 puffs into the lungs daily. For asthma 08/26/17   Mesner, Barbara Cower, MD  gabapentin (NEURONTIN) 300 MG capsule Take 2 capsules (600 mg total) by mouth 3 (three) times daily. For agitation 08/13/17   Armandina Stammer I, NP   montelukast (SINGULAIR) 5 MG chewable tablet Chew 1 tablet (5 mg total) by mouth every morning. For Asthma 08/31/17 09/30/17  Shaune Pollack, MD  pantoprazole (PROTONIX) 40 MG tablet Take 1 tablet (40 mg total) by mouth daily. For acid reflux 08/31/17 09/30/17  Shaune Pollack, MD  propranolol (INDERAL) 40 MG tablet Take 1 tablet (40 mg total) by mouth 3 (three) times daily. For anxiety 08/13/17   Armandina Stammer I, NP  traZODone (DESYREL) 50 MG tablet Take 1 tablet (50 mg total) by mouth at bedtime as needed for sleep. 08/13/17   Armandina Stammer I, NP  ziprasidone (GEODON) 80 MG capsule Take 1 capsule (80 mg total) by mouth 2 (two) times daily with a meal. For mood control 08/13/17   Sanjuana Kava, NP      Allergies    Patient has no known allergies.    Review of Systems   Review of Systems  All other systems reviewed and are negative.   Physical Exam Updated Vital Signs BP 117/88 (BP Location: Right Arm)   Pulse 85   Temp 98 F (36.7 C) (Oral)   Resp 17   SpO2 98%  Physical Exam Vitals and nursing note reviewed.   27 year old male, resting comfortably and in no acute distress. Vital signs are normal. Oxygen saturation is 98%, which is normal. Head is normocephalic and atraumatic. PERRLA, EOMI. Oropharynx is clear. Neck is nontender and supple without adenopathy. Lungs are clear without rales, wheezes, or rhonchi. Chest  is nontender. Heart has regular rate and rhythm without murmur. Abdomen is soft, flat, nontender. Extremities have no cyanosis or edema, full range of motion is present.  Scars present on left forearm from prior self-mutilation. Skin is warm and dry without rash. Neurologic: Mental status is normal, cranial nerves are intact, moves all extremities equally. Psychiatric: Normal mental status, no signs of depression.  Does not appear to be responding to internal stimuli.  Normal thought processes.  ED Results / Procedures / Treatments    Procedures Procedures     Medications Ordered in ED Medications - No data to display  ED Course/ Medical Decision Making/ A&P                             Medical Decision Making Risk Prescription drug management.   Transient suicidal thoughts related to anger at having a disagreement with a friend, now resolved.  He shows no signs of depression or suicidal ideation currently, no indication for psychiatric evaluation.  I am resending his involuntary commitment.  Final Clinical Impression(s) / ED Diagnoses Final diagnoses:  Anger reaction    Rx / DC Orders ED Discharge Orders     None         Dione Booze, MD 08/12/22 0056  Patient's group home has refused to accept him back there, patient's mother is refusing to come pick him up.  He will need to be kept in the emergency department for social service to find appropriate housing for him.    Dione Booze, MD 08/12/22 [phone]

## 2022-08-12 NOTE — ED Provider Notes (Signed)
I received a call from social work that patient is to be discharged and [employer] is coming to pick him up and return him back to his group home.   Terrilee Files, MD 08/13/22 1240

## 2022-08-12 NOTE — ED Notes (Signed)
Spoke with patient's mother, who referred me to group home staff, Alla German at [phone]. Upon speaking with this individual, he stated that they were refusing to take the patient back, as he is causing too much upset in the group home. MD and Charge RN notified.

## 2022-08-12 NOTE — ED Notes (Signed)
Spoke with patient's mother who was unaware that patient had been brought to hospital. She advised that their care coordinator had been working on getting him moved to a different level of care. She was advised that group home was refusing to take him back. She advised she did not think that she could handle him either.

## 2022-08-12 NOTE — ED Notes (Signed)
IVC papers rescinded by Dr. Tonette Lederer Original in red folder Copy in medical records drawer

## 2022-08-12 NOTE — Progress Notes (Addendum)
08/12/2022 @ 7:47 pm TOC CSW attempted to contact Alla German, [facility] [phone].  CSW left HIPPA compliant message with my contact information.  No one was at [address], [place], [place] [address].    :49pm   TOC CSW attempted to contact Alla German, [facility] [phone].  :50pm   TOC CSW attempted to contact supervisor on call to update on group homes refusal to accept pt back.     :51pm TOC CSW attempted to contact Alexa Hayes [phone]. CSW left HIPPA compliant message with my contact information.  :52pm TOC CSW attempted to contact the on-call person at [facility] (per [employer]) [phone].  CSW spoke with Romualdo Bolk.  Archie Patten stated she will attempt to contact someone at [facility].    :07pm Romualdo Bolk, [facility] returned CSW's phone call.  Archie Patten stated that she had made several attempts by phone and text with no response from Alla German, [facility].  While [place] and CSW were on the phone Windy Fast returns her phone call.  Tonya then merges the call.  Archie Patten states that this dc is not in compliance with the dc meeting he had with legal guardian, Arvid Right.  Windy Fast then stated that pt can not come back to the group home he has been discharged.  CSW then states Windy Fast this is not in compliance with the dc of 09/23/2022 that was request by [facility] to [facility].  Then, a individual gets on the phone of Windy Fast and states they are a para professional and the pt is a health and safety Hazard and a threat to himself and others and pt can not return to the group home.  Tonya then states that [facility] has not properly received the dc because they have not contacted her, therefore pt is not dc'd from the group home.  Archie Patten also states that [facility] is not being compliant with the previous discussed dc date of 09/23/2022.  CSW then states that [employer] is contacting the  state to report the group homes refusal to accept pt back.  The para professional then states the pt has been dc'd and can not return, and proceeded to hang up.  Tonya apologizes to CSW for the call being unprofessional.  Archie Patten also stated she didn't understand their behavior because Alexa had notifed group home of his return via email.  Archie Patten states that she will make Alexa aware of the phone conversation tonight and she will being searching tomorrow for a respite or group home placement.  CSW informed Archie Patten that pt will return and we will readmit and speak with Alexa tomorrow.  CSW thanked [place] for all of her assistance and call ended.  :15pm TOC CSW receives a call from [phone], the individual on the phone states this is Renisha.  CSW responded with hi and recognizes the voice,  its the para professional from the previous call with [place] at [facility].  Renisha then states that I don't know what you ppl don't get, he has been dc'd from the group home.  The pt is a health and safety hazard and a threat to himself and others.  CSW then stated that the pt has not exhibited those behaviors at the hospital.  Stormy Card then stated well he has been dc'd from the group home.  CSW then informed Renisha that [employer] would be reporting this refusal to the state.  Stormy Card stated he has been dc'd and proceeded to hung up.  @ 8:18pm TOC CSW updated TOC supervisor on call with the current situation with [facility].  Jovian Lembcke Tarpley-Carter, MSW, LCSW-A Pronouns:  She/Her/Hers [facility] Clinical Social Worker Direct Number:  [phone] [email or web address]

## 2022-08-12 NOTE — Progress Notes (Signed)
CSW contacted patients mother Jarett Dralle [phone] . Ms. Zahradnik stated she was informed of patients discharge and she contacted patients care coordinator [name] who has not responded to her yet. Ms. Floren gave CSW patients legal guardian with the [facility]'s number [phone]) Arvid Right. CSW contacted Alexa and left a message requesting a call back.

## 2022-08-13 ENCOUNTER — Telehealth (HOSPITAL_COMMUNITY): Payer: Self-pay

## 2022-08-13 DIAGNOSIS — R454 Irritability and anger: Secondary | ICD-10-CM | POA: Diagnosis not present

## 2022-08-13 NOTE — ED Notes (Signed)
IVC paperwork is in blue

## 2022-08-13 NOTE — Progress Notes (Signed)
CSW contacted patients legal guardian Arvid Right with the [facility]. CSW provided Ms. Madilyn Fireman with an update and encouraged her to reach out to the state as well in regards to the group home. CSW contacted patients care coordinator [name] with [facility]. CSW left an voicemail requesting a call back. CSW has not heard from patients care coordinator.

## 2022-08-13 NOTE — ED Notes (Signed)
IVC paperwork is in the orange zone

## 2022-08-13 NOTE — Progress Notes (Signed)
CSW contacted the [facility] [employer], [phone] to make a formal complaint against patients group home Maurice [name], [facility] [phone]). The group home refused to let patient in the home last night. The group home was obligated to keep patient in their home until 09/23/22 according to their 60 day discharge notice.  CSW was told this matter will be investigated and a state representative would give CSW a call back for further information.

## 2022-08-13 NOTE — ED Notes (Signed)
IVC paperwork was rescinded on 08/12/22

## 2022-08-14 DIAGNOSIS — R454 Irritability and anger: Secondary | ICD-10-CM | POA: Diagnosis not present

## 2022-08-14 MED ORDER — CLONIDINE HCL 0.1 MG PO TABS
0.0500 mg | ORAL_TABLET | Freq: Two times a day (BID) | ORAL | Status: DC
  Administered 2022-08-14 – 2022-09-10 (×52): 0.05 mg via ORAL
  Filled 2022-08-14 (×54): qty 1

## 2022-08-14 NOTE — ED Notes (Signed)
Pt remains calm, cooperative, and appropriate. ?

## 2022-08-14 NOTE — ED Notes (Signed)
Guardian at bedside. 

## 2022-08-14 NOTE — ED Notes (Signed)
Pt being interviewed by Group Home w/ Guardian at bedside.

## 2022-08-14 NOTE — ED Provider Notes (Signed)
  Physical Exam  BP (!) 144/60   Pulse 100   Temp 98.2 F (36.8 C) (Oral)   Resp 16   Ht 1.803 m ( )   SpO2 98%   BMI 20.63 kg/m   Physical Exam Well-developed well-nourished male seen in the bed no acute distress Patient is hypertensive.   Procedures  Procedures  ED Course / MDM    Medical Decision Making Amount and/or Complexity of Data Reviewed Labs: ordered.  Risk OTC drugs. Prescription drug management.  Reviewed prior meds and has been taking 0.05 mg of clonidine twice daily.  This has been ordered as needed here.  I have started a scheduled dose. 27 year old male who is a group home who presented to the ED due to some type of problem that occurred at the group home.  At that time he was suicidal was seen in the ED and was cleared.  Patient was returned to his group however there is no one there to let him in.  He was then returned to the ED via PD again.  CSW is waiting to hear from patient's care coordinator and having him screened for placement today.       Margarita Grizzle, MD 08/14/22 [phone]

## 2022-08-14 NOTE — Progress Notes (Signed)
Patient has a potential virtual or in-person screening for placement today.  CSW is waiting to here from patients care coordinator, [name], [phone] with [employer].

## 2022-08-14 NOTE — ED Notes (Signed)
Pt remains calm and cooperative.  Pt took medications w/o incident.  ?

## 2022-08-14 NOTE — Discharge Planning (Signed)
RNCM escorted group home members to pt room for interview.  Pt guardian was visiting with pt and is present for interview.

## 2022-08-15 DIAGNOSIS — R454 Irritability and anger: Secondary | ICD-10-CM | POA: Diagnosis not present

## 2022-08-15 NOTE — ED Provider Notes (Signed)
Emergency Medicine Observation Re-evaluation Note  COEN MIYASATO is a 27 y.o. male, seen on rounds today.  Pt initially presented to the ED for complaints of Suicidal Currently, the patient is sleeping.  Physical Exam  BP (!) 142/69 (BP Location: Right Arm)   Pulse 87   Temp 98.1 F (36.7 C) (Oral)   Resp 18   Ht  (1.803 m)   SpO2 98%   BMI 20.63 kg/m  Physical Exam General: No distress Cardiac: Well-perfused Lungs: No increased work of breathing Psych: Calm  ED Course / MDM  EKG:EKG Interpretation  Date/Time:  Tuesday August 12 2022 20:54:35 EDT Ventricular Rate:  81 PR Interval:  112 QRS Duration: 76 QT Interval:  352 QTC Calculation: 408 R Axis:   39 Text Interpretation: Normal sinus rhythm Normal ECG When compared with ECG of 07-Aug-2017 17:16, PREVIOUS ECG IS PRESENT Confirmed by Derwood Kaplan [phone]) on 08/12/2022 10:39:56 PM  I have reviewed the labs performed to date as well as medications administered while in observation.  Recent changes in the last 24 hours include awaiting plan to return to his group home..  Plan  Current plan is for likely return to group home.    Glynn Octave, MD 08/15/22 [phone]

## 2022-08-15 NOTE — Progress Notes (Signed)
CSW contacted patients legal guardian Alexa Madilyn Fireman to get an update on patients interview.

## 2022-08-15 NOTE — ED Notes (Signed)
ED Provider at bedside. 

## 2022-08-15 NOTE — ED Notes (Signed)
Current status of patient is voluntary per review and discussion with RN.

## 2022-08-15 NOTE — Progress Notes (Signed)
Maurice [name] with [facility] will be moving forward with placement. Maurice and patients care coordinator Maurice [name] with [name] will be meeting later today to discuss paperwork and next steps.   CSW received a call back from patients guardian Maurice [name] who stated the placement sounds promising.

## 2022-08-16 DIAGNOSIS — R454 Irritability and anger: Secondary | ICD-10-CM | POA: Diagnosis not present

## 2022-08-16 NOTE — ED Provider Notes (Signed)
Emergency Medicine Observation Re-evaluation Note  AZZAN BUTLER is a 27 y.o. male, seen on rounds today.  Pt initially presented to the ED for complaints of Suicidal Currently, the patient is resting comfortably.  Physical Exam  BP 130/76 (BP Location: Right Arm)   Pulse 81   Temp 97.9 F (36.6 C) (Oral)   Resp 18   Ht  (1.803 m)   SpO2 97%   BMI 20.63 kg/m  Physical Exam General: Resting comfortably in stretcher Lungs: Normal work of breathing Psych: Calm  ED Course / MDM  EKG:EKG Interpretation  Date/Time:  Tuesday August 12 2022 20:54:35 EDT Ventricular Rate:  81 PR Interval:  112 QRS Duration: 76 QT Interval:  352 QTC Calculation: 408 R Axis:   39 Text Interpretation: Normal sinus rhythm Normal ECG When compared with ECG of 07-Aug-2017 17:16, PREVIOUS ECG IS PRESENT Confirmed by Derwood Kaplan [phone]) on 08/12/2022 10:39:56 PM  I have reviewed the labs performed to date as well as medications administered while in observation.  Recent changes in the last 24 hours include patient cleared by psychiatry and is awaiting group home placement.  Plan  Current plan is for group home placement.  Patient cleared by psychiatry for his suicidal ideation   Rondel Baton, MD 08/16/22 2110

## 2022-08-17 DIAGNOSIS — R454 Irritability and anger: Secondary | ICD-10-CM | POA: Diagnosis not present

## 2022-08-17 NOTE — ED Provider Notes (Signed)
Emergency Medicine Observation Re-evaluation Note  Maurice [name] is a 27 y.o. male, seen on rounds today.  Pt initially presented to the ED for complaints of Suicidal Currently, the patient is not having any complaints.  Physical Exam  BP 113/72 (BP Location: Right Arm)   Pulse 72   Temp 98 F (36.7 C) (Oral)   Resp 15   Ht  (1.803 m)   SpO2 97%   BMI 20.63 kg/m  Physical Exam General: Resting comfortably in stretcher Lungs: Normal work of breathing Psych: Calm  ED Course / MDM  EKG:EKG Interpretation  Date/Time:  Tuesday August 12 2022 20:54:35 EDT Ventricular Rate:  81 PR Interval:  112 QRS Duration: 76 QT Interval:  352 QTC Calculation: 408 R Axis:   39 Text Interpretation: Normal sinus rhythm Normal ECG When compared with ECG of 07-Aug-2017 17:16, PREVIOUS ECG IS PRESENT Confirmed by Derwood Kaplan [phone]) on 08/12/2022 10:39:56 PM  I have reviewed the labs performed to date as well as medications administered while in observation.  Recent changes in the last 24 hours include no acute events.  Plan  Current plan is for group home placement.  Patient has been cleared by psychiatry.    Rondel Baton, MD 08/17/22 1136

## 2022-08-18 DIAGNOSIS — R454 Irritability and anger: Secondary | ICD-10-CM | POA: Diagnosis not present

## 2022-08-18 NOTE — ED Notes (Signed)
Pt report received from previous nurse. Pt A&O x4, denies needs/complaints. No acute distress noted.  

## 2022-08-18 NOTE — ED Provider Notes (Signed)
Emergency Medicine Observation Re-evaluation Note  Maurice [name] is a 27 y.o. male, seen on rounds today.  Pt initially presented to the ED for complaints of Suicidal Currently, the patient is resting comfortably in NAD.  Physical Exam  BP 112/70   Pulse 94   Temp 98 F (36.7 C)   Resp 16   Ht  (1.803 m)   SpO2 98%   BMI 20.63 kg/m  Physical Exam General: Appears to be resting comfortably in bed, no acute distress. Cardiac: Regular rate, normal heart rate, non-emergent blood pressure for this morning's vitals. Lungs: No increased work of breathing.  Equal chest rise appreciated Psych: Calm, asleep in bed.   ED Course / MDM  EKG:EKG Interpretation  Date/Time:  Tuesday August 12 2022 20:54:35 EDT Ventricular Rate:  81 PR Interval:  112 QRS Duration: 76 QT Interval:  352 QTC Calculation: 408 R Axis:   39 Text Interpretation: Normal sinus rhythm Normal ECG When compared with ECG of 07-Aug-2017 17:16, PREVIOUS ECG IS PRESENT Confirmed by Derwood Kaplan [phone]) on 08/12/2022 10:39:56 PM  I have reviewed the labs performed to date as well as medications administered while in observation.   Plan  Since their initial presentation, this patient has been evaluated by psychiatry and deemed to not have an emergent psychiatric condition.  Psychiatry has recommended Transitions of Care team consultation for arrangement for safe disposition planning which is underway at this time.  Appreciate recommendations on disposition from [facility] team.     Glyn Ade, MD 08/18/22 [phone]

## 2022-08-18 NOTE — Progress Notes (Signed)
CSW sent a message to patients guardian and care coordinator to get an update on placement. CSW is awaiting a response back.

## 2022-08-19 DIAGNOSIS — R454 Irritability and anger: Secondary | ICD-10-CM | POA: Diagnosis not present

## 2022-08-19 NOTE — Progress Notes (Signed)
TOC CSW contacted pts legal guardian, Arvid Right [phone].  Alexa stated that both she and [name], Care Coordinator at [facility] [phone] are awaiting return communication from group home on next steps.  Marzell Allemand Tarpley-Carter, MSW, LCSW-A Pronouns:  She/Her/Hers [facility] Clinical Social Worker Direct Number:  [phone] [email or web address]

## 2022-08-19 NOTE — Progress Notes (Signed)
CSW contacted patients legal guardian to find out about placement. CSW was told that the care coordinator sent a message to [facility] but didn't get a response back yesterday.    CSW contacted [facility] and left a message for Clearnce Sorrel, chief Probation officer. CSW also left a message with the main desk to give CSW a call.

## 2022-08-19 NOTE — ED Provider Notes (Signed)
Emergency Medicine Observation Re-evaluation Note  Maurice [name] is a 27 y.o. male, seen on rounds today.  Pt initially presented to the ED for complaints of Suicidal Currently, the patient is awake and alert.  Cooperative overnight.  It looks like he's been psych cleared and SW is trying to get placement  Physical Exam  BP (!) 138/92 (BP Location: Right Arm)   Pulse 83   Temp (!) 97.4 F (36.3 C) (Oral)   Resp 15   Ht  (1.803 m)   SpO2 98%   BMI 20.63 kg/m  Physical Exam General: awake and alert Cardiac: rr Lungs: clear Psych: calm  ED Course / MDM  EKG:EKG Interpretation  Date/Time:  Tuesday August 12 2022 20:54:35 EDT Ventricular Rate:  81 PR Interval:  112 QRS Duration: 76 QT Interval:  352 QTC Calculation: 408 R Axis:   39 Text Interpretation: Normal sinus rhythm Normal ECG When compared with ECG of 07-Aug-2017 17:16, PREVIOUS ECG IS PRESENT Confirmed by Derwood Kaplan [phone]) on 08/12/2022 10:39:56 PM  I have reviewed the labs performed to date as well as medications administered while in observation.  Recent changes in the last 24 hours include none.  Plan  Current plan is for group home.    Jacalyn Lefevre, MD 08/19/22 [phone]

## 2022-08-19 NOTE — ED Notes (Signed)
Legal Guardian at bedside.  

## 2022-08-19 NOTE — ED Notes (Signed)
Patient at RN station to make a phone call-Monique,RN

## 2022-08-20 DIAGNOSIS — R454 Irritability and anger: Secondary | ICD-10-CM | POA: Diagnosis not present

## 2022-08-20 NOTE — ED Notes (Signed)
Voluntary at this time. 

## 2022-08-20 NOTE — Progress Notes (Signed)
TOC CSW spoke with Arvid Right, legal guardian [phone].  Group Home sent packet to start admission process on 08/19/2022.  Alexa will continue to update.  Teleah Villamar Tarpley-Carter, MSW, LCSW-A Pronouns:  She/Her/Hers [facility] Clinical Social Worker Direct Number:  [phone] [email or web address]

## 2022-08-20 NOTE — ED Provider Notes (Signed)
Emergency Medicine Observation Re-evaluation Note  Maurice [name] is a 27 y.o. male, seen on rounds today.  Pt initially presented to the ED for complaints of Suicidal Currently, the patient is resting comfortably in NAD.  Physical Exam  BP 121/65 (BP Location: Right Arm)   Pulse 80   Temp 97.7 F (36.5 C) (Oral)   Resp 16   Ht  (1.803 m)   SpO2 100%   BMI 20.63 kg/m  Physical Exam General: Appears to be resting comfortably in bed, no acute distress. Cardiac: Regular rate, normal heart rate, non-emergent blood pressure for this morning's vitals. Lungs: No increased work of breathing.  Equal chest rise appreciated Psych: Calm, asleep in bed.   ED Course / MDM  EKG:EKG Interpretation  Date/Time:  Tuesday August 12 2022 20:54:35 EDT Ventricular Rate:  81 PR Interval:  112 QRS Duration: 76 QT Interval:  352 QTC Calculation: 408 R Axis:   39 Text Interpretation: Normal sinus rhythm Normal ECG When compared with ECG of 07-Aug-2017 17:16, PREVIOUS ECG IS PRESENT Confirmed by Derwood Kaplan [phone]) on 08/12/2022 10:39:56 PM  I have reviewed the labs performed to date as well as medications administered while in observation.   Plan  Since their initial presentation, this patient has been evaluated by psychiatry and deemed to not have an emergent psychiatric condition.  Psychiatry has recommended Transitions of Care team consultation for arrangement for safe disposition planning which is underway at this time.  Appreciate recommendations on disposition from [facility] team.   Glyn Ade, MD 08/20/22 [phone]

## 2022-08-20 NOTE — Progress Notes (Signed)
Placement is pending at this time. No new updates from patients care coordinator

## 2022-08-21 DIAGNOSIS — R454 Irritability and anger: Secondary | ICD-10-CM | POA: Diagnosis not present

## 2022-08-21 NOTE — ED Notes (Signed)
Meal provided 

## 2022-08-21 NOTE — ED Notes (Signed)
Pt is awake, a&ox4 and resting in the bed. Pt has experienced a nose bleed this AM which he has under control. Pt has no other complaints or distress.

## 2022-08-21 NOTE — ED Provider Notes (Signed)
Emergency Medicine Observation Re-evaluation Note  Maurice [name] is a 27 y.o. male, seen on rounds today.  Pt initially presented to the ED for complaints of Suicidal Currently, the patient is resting, in no distress.  Physical Exam  BP 132/80 (BP Location: Right Arm)   Pulse (!) 110   Temp 97.6 F (36.4 C) (Oral)   Resp 16   Ht  (1.803 m)   SpO2 99%   BMI 20.63 kg/m  Physical Exam General: Calm Cardiac: Regular rate and rhythm occasional mild tachycardia Lungs: No increased work of breathing Psych: Calm  ED Course / MDM  EKG:EKG Interpretation  Date/Time:  Tuesday August 12 2022 20:54:35 EDT Ventricular Rate:  81 PR Interval:  112 QRS Duration: 76 QT Interval:  352 QTC Calculation: 408 R Axis:   39 Text Interpretation: Normal sinus rhythm Normal ECG When compared with ECG of 07-Aug-2017 17:16, PREVIOUS ECG IS PRESENT Confirmed by Derwood Kaplan [phone]) on 08/12/2022 10:39:56 PM  I have reviewed the labs performed to date as well as medications administered while in observation.  Recent changes in the last 24 hours include none.  Plan  Current plan is for placement.    Gerhard Munch, MD 08/21/22 [phone]

## 2022-08-21 NOTE — ED Notes (Signed)
ED Provider at bedside. 

## 2022-08-21 NOTE — Progress Notes (Signed)
Placement is still pending. No updates from patients care coordinator.

## 2022-08-21 NOTE — ED Notes (Signed)
Patient is voluntarily for now voluntarily consent form attached to clipboard in purple zone.

## 2022-08-22 DIAGNOSIS — R454 Irritability and anger: Secondary | ICD-10-CM | POA: Diagnosis not present

## 2022-08-22 NOTE — ED Notes (Addendum)
Pt resting comfortably. NAD.

## 2022-08-22 NOTE — Progress Notes (Signed)
No updates on placement  

## 2022-08-22 NOTE — ED Notes (Signed)
Report received assumed care at this time pt asleep resp eben and non labored in view of RN station with sitter

## 2022-08-22 NOTE — ED Provider Notes (Signed)
Emergency Medicine Observation Re-evaluation Note  Maurice [name] is a 27 y.o. male, seen on rounds today.  Pt initially presented to the ED for complaints of Suicidal Currently, the patient is asleep.  Physical Exam  BP 134/84 (BP Location: Left Arm)   Pulse 86   Temp 98 F (36.7 C) (Oral)   Resp 16   Ht 5\' 11"  (1.803 m)   SpO2 100%   BMI 20.63 kg/m  Physical Exam General: asleep Cardiac: asleep Lungs: asleep Psych: asleep  ED Course / MDM  EKG:EKG Interpretation  Date/Time:  Tuesday August 12 2022 20:54:35 EDT Ventricular Rate:  81 PR Interval:  112 QRS Duration: 76 QT Interval:  352 QTC Calculation: 408 R Axis:   39 Text Interpretation: Normal sinus rhythm Normal ECG When compared with ECG of 07-Aug-2017 17:16, PREVIOUS ECG IS PRESENT Confirmed by Derwood Kaplan [phone]) on 08/12/2022 10:39:56 PM  I have reviewed the labs performed to date as well as medications administered while in observation.  No recent changes in the last 24 hours.  Plan  Current plan is for placement.    Pricilla Loveless, MD 08/22/22 [phone]

## 2022-08-22 NOTE — ED Notes (Signed)
Pt resting watching television

## 2022-08-23 DIAGNOSIS — R454 Irritability and anger: Secondary | ICD-10-CM | POA: Diagnosis not present

## 2022-08-23 NOTE — ED Provider Notes (Signed)
Emergency Medicine Observation Re-evaluation Note  Maurice [name] is a 27 y.o. male, seen on rounds today.  Pt initially presented to the ED for complaints of Suicidal Currently, the patient is resting, watching television.  Physical Exam  BP (!) 142/89 (BP Location: Right Arm)   Pulse (!) 104   Temp 98.3 F (36.8 C)   Resp 18   Ht 5\' 11"  (1.803 m)   Wt 67.1 kg   SpO2 99%   BMI 20.63 kg/m  Physical Exam General: No acute distress Cardiac: Regular rate Lungs: No respiratory distress Psych: Calm  ED Course / MDM  EKG:EKG Interpretation  Date/Time:  Tuesday August 12 2022 20:54:35 EDT Ventricular Rate:  81 PR Interval:  112 QRS Duration: 76 QT Interval:  352 QTC Calculation: 408 R Axis:   39 Text Interpretation: Normal sinus rhythm Normal ECG When compared with ECG of 07-Aug-2017 17:16, PREVIOUS ECG IS PRESENT Confirmed by Derwood Kaplan [phone]) on 08/12/2022 10:39:56 PM  I have reviewed the labs performed to date as well as medications administered while in observation.  Recent changes in the last 24 hours include -no updates on placement.  Plan  Current plan is for patient to be held in the emergency room for placement.    Derwood Kaplan, MD 08/23/22 1154

## 2022-08-24 DIAGNOSIS — R454 Irritability and anger: Secondary | ICD-10-CM | POA: Diagnosis not present

## 2022-08-24 NOTE — ED Notes (Signed)
Patient made phone call to mother.

## 2022-08-24 NOTE — ED Notes (Signed)
Patient ambulated around nursing station. Alert and oriented no apparent distress.

## 2022-08-24 NOTE — ED Notes (Signed)
Ivc status check and is current

## 2022-08-24 NOTE — ED Notes (Signed)
This patient is voluntary

## 2022-08-24 NOTE — ED Provider Notes (Signed)
Emergency Medicine Observation Re-evaluation Note  Maurice [name] is a 27 y.o. male, seen on rounds today.  Pt initially presented to the ED for complaints of Suicidal Currently, the patient is asleep, upon waking up he states that he has no complaints from his side.  Patient has finished some of his breakfast.  Physical Exam  BP 130/70   Pulse 91   Temp 98.2 F (36.8 C)   Resp 16   Ht 5\' 11"  (1.803 m)   Wt 67.1 kg   SpO2 100%   BMI 20.63 kg/m  Physical Exam General: No acute distress Cardiac: Regular rate Lungs: No respiratory distress Psych: Calm  ED Course / MDM  EKG:EKG Interpretation  Date/Time:  Tuesday August 12 2022 20:54:35 EDT Ventricular Rate:  81 PR Interval:  112 QRS Duration: 76 QT Interval:  352 QTC Calculation: 408 R Axis:   39 Text Interpretation: Normal sinus rhythm Normal ECG When compared with ECG of 07-Aug-2017 17:16, PREVIOUS ECG IS PRESENT Confirmed by Derwood Kaplan [phone]) on 08/12/2022 10:39:56 PM  I have reviewed the labs performed to date as well as medications administered while in observation.  Recent changes in the last 24 hours include -no new updates.  Last social work note was from 4-26, and there were no updates at that time.  Plan  Current plan is for continue to hold for placement.  No labs indicated.    Derwood Kaplan, MD 08/24/22 1125

## 2022-08-24 NOTE — ED Notes (Signed)
Patient is calm and cooperative, was out at the desk chatting with staff. Now back in his room relaxing.

## 2022-08-24 NOTE — ED Notes (Signed)
Pt out of room to desk interacting appropriately with staff. Pt pleasant and cooperative.

## 2022-08-24 NOTE — ED Notes (Signed)
Patient meal tray provided

## 2022-08-24 NOTE — ED Notes (Signed)
Pt alert, NAD, calm, interactive, resps e/u, speaking in clear complete sentences, meal finished, pt out to common trash can to throw trash away, steady gait. Back to room.

## 2022-08-25 DIAGNOSIS — R454 Irritability and anger: Secondary | ICD-10-CM | POA: Diagnosis not present

## 2022-08-25 NOTE — ED Notes (Signed)
Patient sleeping, normal respirations, no distress.  

## 2022-08-25 NOTE — Progress Notes (Signed)
CSW contacted Maurice [name], legal guardian [phone] to follow-up with placement. CSW left an VM requesting a call back.

## 2022-08-25 NOTE — ED Notes (Signed)
Woke patient for vitals and his olanzapine. He said he has no problem taking it, but said he prefers it with his other medications. Told him that was fine, I asked pharmacy to adjust the times and I will inform the day shift RN.

## 2022-08-25 NOTE — ED Provider Notes (Signed)
Emergency Medicine Observation Re-evaluation Note  Maurice [name] is a 27 y.o. male, seen on rounds today.  Pt initially presented to the ED for complaints of Suicidal Currently, the patient is watching TV.  Physical Exam  BP 118/78 (BP Location: Right Arm)   Pulse (!) 104   Temp 97.8 F (36.6 C) (Oral)   Resp 16   Ht 5\' 11"  (1.803 m)   Wt 67.1 kg   SpO2 98%   BMI 20.63 kg/m  Physical Exam General: No acute distress Cardiac: Regular rate Lungs: No respiratory distress Psych: Calm  ED Course / MDM  EKG:EKG Interpretation  Date/Time:  Tuesday August 12 2022 20:54:35 EDT Ventricular Rate:  81 PR Interval:  112 QRS Duration: 76 QT Interval:  352 QTC Calculation: 408 R Axis:   39 Text Interpretation: Normal sinus rhythm Normal ECG When compared with ECG of 07-Aug-2017 17:16, PREVIOUS ECG IS PRESENT Confirmed by Derwood Kaplan [phone]) on 08/12/2022 10:39:56 PM  I have reviewed the labs performed to date as well as medications administered while in observation.  Recent changes in the last 24 hours include -no new updates.   Plan  Current plan is for continue to hold for placement.    Loetta Rough, MD 08/25/22 1249

## 2022-08-25 NOTE — ED Notes (Signed)
Patient sleeping, normal chest rise and fall, no distress.

## 2022-08-26 DIAGNOSIS — R454 Irritability and anger: Secondary | ICD-10-CM | POA: Diagnosis not present

## 2022-08-26 NOTE — Progress Notes (Signed)
CSW received a response from [facility] with [employer] who informed CSW that the health safety is scheduled today.

## 2022-08-26 NOTE — Progress Notes (Signed)
CSW received an email from patients care coordinator [name] stating they are currently waiting on the health and safety walk through of patients new group home. CSW is waiting for a response back from [facility] on a completion date.

## 2022-08-26 NOTE — Progress Notes (Signed)
CSW received a call from the [facility] [employer]. Ms. Olena Leatherwood stated she is investigated patients previous group home and their refusal to take patient back. Ms. Olena Leatherwood stated she will come and visit patient tomorrow.    CSW contacted Alexa Madilyn Fireman, legal guardian [phone] to follow-up with placement. CSW left an VM requesting a call back    CSW also sent an e-mail to patients guardian and care coordinator.

## 2022-08-26 NOTE — ED Provider Notes (Signed)
Emergency Medicine Observation Re-evaluation Note  JAKWON GAYTON is a 27 y.o. male, seen on rounds today.  Pt initially presented to the ED for complaints of Suicidal Currently, the patient is asleep.  Pt has been here for several days awaiting group home placement.  No problems reported overnight.  Physical Exam  BP (!) 159/91 (BP Location: Right Arm)   Pulse 97   Temp 98.7 F (37.1 C) (Oral)   Resp 20   Ht 5\' 11"  (1.803 m)   Wt 67.1 kg   SpO2 95%   BMI 20.63 kg/m  Physical Exam General: asleep Cardiac: rr Lungs: clear Psych: calm  ED Course / MDM  EKG:EKG Interpretation  Date/Time:  Tuesday August 12 2022 20:54:35 EDT Ventricular Rate:  81 PR Interval:  112 QRS Duration: 76 QT Interval:  352 QTC Calculation: 408 R Axis:   39 Text Interpretation: Normal sinus rhythm Normal ECG When compared with ECG of 07-Aug-2017 17:16, PREVIOUS ECG IS PRESENT Confirmed by Derwood Kaplan [phone]) on 08/12/2022 10:39:56 PM  I have reviewed the labs performed to date as well as medications administered while in observation.  Recent changes in the last 24 hours include none.  Plan  Current plan is for group home placement.    Jacalyn Lefevre, MD 08/26/22 [phone]

## 2022-08-26 NOTE — ED Notes (Signed)
Pt is A&O x4. Denies HA, CP, or SOB. Skin is intact. Ambulates with a safe, steady gait.

## 2022-08-27 DIAGNOSIS — R454 Irritability and anger: Secondary | ICD-10-CM | POA: Diagnosis not present

## 2022-08-27 NOTE — ED Notes (Signed)
Pts care assumed, report received from previous RN. Pt awake, alert and calm, watching TV. Given soda and crackers.

## 2022-08-27 NOTE — ED Provider Notes (Signed)
  Physical Exam  BP (!) 144/80 (BP Location: Right Arm)   Pulse 89   Temp 98.2 F (36.8 C) (Oral)   Resp 16   Ht 5\' 11"  (1.803 m)   Wt 67.1 kg   SpO2 99%   BMI 20.63 kg/m   Physical Exam  Procedures  Procedures  ED Course / MDM    Medical Decision Making Amount and/or Complexity of Data Reviewed Labs: ordered.  Risk OTC drugs. Prescription drug management.   Patient pending group home placement.  Reportedly has safety eval today.       Benjiman Core, MD 08/27/22 [phone]

## 2022-08-27 NOTE — ED Notes (Signed)
Pt taken, by tech, to purple zone for a shower. Pt bed linen changed.

## 2022-08-27 NOTE — Progress Notes (Signed)
Discharge date is still pending. Patients medications will need to be sent to the following below at discharge.    Clide Deutscher CPHT Pharmacologist) Psychologist, educational Pharmacy Phone:[phone] Fax:[phone]

## 2022-08-27 NOTE — Progress Notes (Signed)
Sherian Maroon with the [facility] of [employer] [facility] is at bedside interviewing patient in regards to the open investigation on patients previous group home.

## 2022-08-28 DIAGNOSIS — R454 Irritability and anger: Secondary | ICD-10-CM | POA: Diagnosis not present

## 2022-08-28 NOTE — ED Notes (Signed)
Pt is sleeping on bed at this time, respirations are spontaneous, even, unlabored and symmetrical bilaterally. Pt skin tone is appropriate for ethnicity, dry and warm. 

## 2022-08-28 NOTE — ED Notes (Signed)
NAD noted, respirations are equal bilaterally and unlabored at this time. Pt resting in bed and denies any unmet needs.

## 2022-08-28 NOTE — ED Notes (Addendum)
NAD noted, respirations are equal bilaterally and unlabored at this time. Pt resting in bed and denies any unmet needs.  

## 2022-08-28 NOTE — ED Notes (Signed)
Pts mother at bedside with patient

## 2022-08-28 NOTE — Progress Notes (Signed)
No placement updates at this time.

## 2022-08-28 NOTE — ED Notes (Signed)
NAD noted, respirations are equal bilaterally and unlabored at this time. Pt resting in bed and denies any unmet needs.  

## 2022-08-28 NOTE — ED Notes (Signed)
This RN assumed care of patient and received off going transfer of care report from off going RN. Pt is sleeping on bed at this time, respirations are spontaneous, even, unlabored and symmetrical bilaterally. Pt skin tone is appropriate for ethnicity, dry and warm.

## 2022-08-28 NOTE — ED Notes (Signed)
Pt is sleeping on bed at this time, respirations are spontaneous, even, unlabored and symmetrical bilaterally. Pt skin tone is appropriate for ethnicity, dry and warm.

## 2022-08-28 NOTE — ED Provider Notes (Signed)
Emergency Medicine Observation Re-evaluation Note  Maurice [name] is a 27 y.o. male, seen on rounds today.  Pt initially presented to the ED for complaints of Suicidal Currently, the patient is sleeping quietly.  Physical Exam  BP (!) 150/81 (BP Location: Right Arm)   Pulse 95   Temp 97.9 F (36.6 C) (Oral)   Resp 16   Ht 5\' 11"  (1.803 m)   Wt 67.1 kg   SpO2 100%   BMI 20.63 kg/m  Physical Exam General: No acute distress Cardiac: Well-perfused Lungs: Nonlabored Psych: Calm  ED Course / MDM  EKG:EKG Interpretation  Date/Time:  Tuesday August 12 2022 20:54:35 EDT Ventricular Rate:  81 PR Interval:  112 QRS Duration: 76 QT Interval:  352 QTC Calculation: 408 R Axis:   39 Text Interpretation: Normal sinus rhythm Normal ECG When compared with ECG of 07-Aug-2017 17:16, PREVIOUS ECG IS PRESENT Confirmed by Derwood Kaplan [phone]) on 08/12/2022 10:39:56 PM  I have reviewed the labs performed to date as well as medications administered while in observation.  Recent changes in the last 24 hours include TTS evaluation.  Plan  Current plan is for group home placement.    Terrilee Files, MD 08/28/22 [phone]

## 2022-08-29 DIAGNOSIS — R454 Irritability and anger: Secondary | ICD-10-CM | POA: Diagnosis not present

## 2022-08-29 NOTE — Progress Notes (Signed)
No updates received from patients care coordinator or legal guardian in regards to a discharge date. Health and safety was completed this week.

## 2022-08-30 DIAGNOSIS — R454 Irritability and anger: Secondary | ICD-10-CM | POA: Diagnosis not present

## 2022-08-30 NOTE — ED Provider Notes (Signed)
Emergency Medicine Observation Re-evaluation Note  Maurice [name] is a 27 y.o. male, seen on rounds today.  Pt initially presented to the ED for complaints of Suicidal Currently, the patient is resting comfortably.  Per nursing staff he slept through the night, no complaints from nursing side.  Physical Exam  BP 122/66   Pulse 74   Temp 98.2 F (36.8 C)   Resp 18   Ht 5\' 11"  (1.803 m)   Wt 67.1 kg   SpO2 100%   BMI 20.63 kg/m  Physical Exam General: No distress Cardiac: Regular rate Lungs: No respiratory distress Psych: Calm  ED Course / MDM  EKG:EKG Interpretation  Date/Time:  Tuesday August 12 2022 20:54:35 EDT Ventricular Rate:  81 PR Interval:  112 QRS Duration: 76 QT Interval:  352 QTC Calculation: 408 R Axis:   39 Text Interpretation: Normal sinus rhythm Normal ECG When compared with ECG of 07-Aug-2017 17:16, PREVIOUS ECG IS PRESENT Confirmed by Derwood Kaplan [phone]) on 08/12/2022 10:39:56 PM  I have reviewed the labs performed to date as well as medications administered while in observation.  Recent changes in the last 24 hours include -no additional updates per social work note from last night.  Plan  Current plan is for holding the patient while he is awaiting placement at the facility/group home.    Derwood Kaplan, MD 08/30/22 [phone]

## 2022-08-30 NOTE — ED Notes (Signed)
Dr. Rosalia Hammers notified patient needed to be seen by a provider this shift.

## 2022-08-31 DIAGNOSIS — R454 Irritability and anger: Secondary | ICD-10-CM | POA: Diagnosis not present

## 2022-08-31 NOTE — ED Notes (Signed)
This RN assumed care of patient and received off going transfer of care report from off going RN. Pt is sleeping on bed at this time, respirations are spontaneous, even, unlabored and symmetrical bilaterally. Pt skin tone is appropriate for ethnicity, dry and warm. 

## 2022-08-31 NOTE — ED Notes (Signed)
Charge updated on pt's need for sitter 1:1

## 2022-08-31 NOTE — ED Notes (Signed)
Pt received for care at 1905.  Pt interacting with sitter at this time; calm, cooperative, and pleasant.  Pt remains in view of sitter with necessary precautions maintained.

## 2022-08-31 NOTE — ED Notes (Signed)
NAD noted, respirations are equal bilaterally and unlabored at this time. Pt resting in gurney and denies any unmet needs. Sitter at bedside.  

## 2022-09-01 DIAGNOSIS — R454 Irritability and anger: Secondary | ICD-10-CM | POA: Diagnosis not present

## 2022-09-01 NOTE — ED Notes (Signed)
Making phone call to mother 

## 2022-09-01 NOTE — ED Notes (Signed)
Pt refused a shower. 

## 2022-09-01 NOTE — Progress Notes (Signed)
E-mail sent to patients legal guardian, care coordinator, and new group home in regards to a discharge date. CSW is awaiting a response back.

## 2022-09-02 DIAGNOSIS — R454 Irritability and anger: Secondary | ICD-10-CM | POA: Diagnosis not present

## 2022-09-02 NOTE — ED Notes (Signed)
Patient's legal guardian at bedside for visit

## 2022-09-02 NOTE — ED Provider Notes (Signed)
Emergency Medicine Observation Re-evaluation Note  Maurice [name] is a 27 y.o. male, seen on rounds today.  Pt initially presented to the ED for complaints of episodic anger/frustration type reaction to situation, and subsequent inappropriate d/c from prior group home.  Pt awaiting new facility placement.   Physical Exam  BP (!) 146/98 (BP Location: Left Arm)   Pulse 87   Temp 97.8 F (36.6 C) (Oral)   Resp 18   Ht 1.803 m (5\' 11" )   Wt 67.1 kg   SpO2 99%   BMI 20.63 kg/m  Physical Exam General: calm, resting.  Cardiac: regular rate.  Lungs: breathing comfortably. Psych: calm.  ED Course / MDM   I have reviewed the labs performed to date as well as medications administered while in observation.  Recent changes in the last 24 hours include ED obs, reassessment.   Plan  TOC team working on new group home placement.     Cathren Laine, MD 09/02/22 [phone]

## 2022-09-02 NOTE — Progress Notes (Signed)
CSW contacted Alexa Madilyn Fireman, legal guardian [phone] to follow-up with placement. Ms. Madilyn Fireman is out of the office. CSW contacted Ms. [facility] supervisor Wanita Chamberlain and left an VM. [phone]

## 2022-09-02 NOTE — ED Notes (Signed)
Patient speaking with mother on phone

## 2022-09-03 DIAGNOSIS — R454 Irritability and anger: Secondary | ICD-10-CM | POA: Diagnosis not present

## 2022-09-03 NOTE — Progress Notes (Signed)
CSW contacted Alexa Madilyn Fireman, legal guardian [phone] to follow-up with placement. CSW left an VM.

## 2022-09-03 NOTE — ED Provider Notes (Signed)
Emergency Medicine Observation Re-evaluation Note  Maurice [name] is a 27 y.o. male, seen on rounds today.  Pt initially presented to the ED for complaints of agitation currently, the patient is resting, awaiting new facility placement.  Physical Exam  BP (!) 137/92 (BP Location: Left Arm)   Pulse 98   Temp 97.8 F (36.6 C) (Oral)   Resp 20   Ht 5\' 11"  (1.803 m)   Wt 67.1 kg   SpO2 99%   BMI 20.63 kg/m  Physical Exam General: No distress Cardiac: Regular rate and rhythm Lungs: Increased work of breathing Psych: Calm  ED Course / MDM  EKG:EKG Interpretation  Date/Time:  Tuesday August 12 2022 20:54:35 EDT Ventricular Rate:  81 PR Interval:  112 QRS Duration: 76 QT Interval:  352 QTC Calculation: 408 R Axis:   39 Text Interpretation: Normal sinus rhythm Normal ECG When compared with ECG of 07-Aug-2017 17:16, PREVIOUS ECG IS PRESENT Confirmed by Derwood Kaplan [phone]) on 08/12/2022 10:39:56 PM  I have reviewed the labs performed to date as well as medications administered while in observation.  Recent changes in the last 24 hours include none.  Plan  Current plan is for ongoing efforts for placement.    Gerhard Munch, MD 09/03/22 [phone]

## 2022-09-04 DIAGNOSIS — R454 Irritability and anger: Secondary | ICD-10-CM | POA: Diagnosis not present

## 2022-09-04 NOTE — ED Provider Notes (Signed)
Emergency Medicine Observation Re-evaluation Note  Maurice [name] is a 27 y.o. male, seen on rounds today.  Pt initially presented to the ED for complaints of Suicidal Currently, the patient is sleeping.  Physical Exam  BP 104/64 (BP Location: Left Arm)   Pulse 77   Temp (!) 97.5 F (36.4 C)   Resp 15   Ht 5\' 11"  (1.803 m)   Wt 67.1 kg   SpO2 97%   BMI 20.63 kg/m  Physical Exam General: no acute distress Cardiac: regular rate Lungs: equal chest rise Psych: calm  ED Course / MDM  EKG:EKG Interpretation  Date/Time:  Tuesday August 12 2022 20:54:35 EDT Ventricular Rate:  81 PR Interval:  112 QRS Duration: 76 QT Interval:  352 QTC Calculation: 408 R Axis:   39 Text Interpretation: Normal sinus rhythm Normal ECG When compared with ECG of 07-Aug-2017 17:16, PREVIOUS ECG IS PRESENT Confirmed by Derwood Kaplan [phone]) on 08/12/2022 10:39:56 PM  I have reviewed the labs performed to date as well as medications administered while in observation.  Recent changes in the last 24 hours include none.  Plan  Current plan is for awaiting group home placement.    Lonell Grandchild, MD 09/04/22 [phone]

## 2022-09-04 NOTE — ED Notes (Signed)
Patient mother came to visit

## 2022-09-04 NOTE — ED Notes (Signed)
Patient mother left.  

## 2022-09-04 NOTE — Progress Notes (Addendum)
This CSW attempted to contact pt's legal guardian, Maurice [name] [phone]), without success. Left HIPAA Compliant voicemail. .  Addend @ 12:15 PM Attempted to contact pt's LG again without success. Left another HIPAA Compliant voicemail. Also emailed [facility] and LG to inquire about anticipated discharge date. TOC following.  Addend @ 2:22 PM Still no communication from LG or [facility] as of yet.

## 2022-09-05 DIAGNOSIS — R454 Irritability and anger: Secondary | ICD-10-CM | POA: Diagnosis not present

## 2022-09-05 NOTE — Progress Notes (Signed)
This CSW attempted to contact Maurice [name] (legal guardian - [phone]) without success. Left HIPAA Compliant voicemail. There has been no communication on the email thread.

## 2022-09-05 NOTE — ED Notes (Signed)
Pt's mom called w/ concerns about pt's weight gain and asking if any of his medications could be causing it. Will notify pharmacy to see if they can help.

## 2022-09-05 NOTE — ED Notes (Signed)
CN pulled sitter from this pt to move to another pt

## 2022-09-05 NOTE — ED Notes (Signed)
CN pulled sitter from this pt to put w/ another pt

## 2022-09-05 NOTE — ED Provider Notes (Signed)
Emergency Medicine Observation Re-evaluation Note  Maurice [name] is a 27 y.o. male, seen on rounds today.  Pt initially presented to the ED for complaints of Suicidal Currently, the patient is asleep in bed.  Physical Exam  BP 112/68 (BP Location: Left Arm)   Pulse 88   Temp (!) 97.4 F (36.3 C) (Oral)   Resp 17   Ht 5\' 11"  (1.803 m)   Wt 67.1 kg   SpO2 99%   BMI 20.63 kg/m  Physical Exam General: Asleep resting comfortably, no acute distress Cardiac: Regular rate Lungs: No increased work of breathing Psych: Calm, asleep  ED Course / MDM  EKG:EKG Interpretation  Date/Time:  Tuesday August 12 2022 20:54:35 EDT Ventricular Rate:  81 PR Interval:  112 QRS Duration: 76 QT Interval:  352 QTC Calculation: 408 R Axis:   39 Text Interpretation: Normal sinus rhythm Normal ECG When compared with ECG of 07-Aug-2017 17:16, PREVIOUS ECG IS PRESENT Confirmed by Derwood Kaplan [phone]) on 08/12/2022 10:39:56 PM  I have reviewed the labs performed to date as well as medications administered while in observation.  Recent changes in the last 24 hours include recommended discharge to group home, pending placement.  Plan  Current plan is for group home placement.    Elayne Snare K, DO 09/05/22 [phone]

## 2022-09-05 NOTE — ED Notes (Signed)
Snack provided. Pt sleeping.

## 2022-09-05 NOTE — ED Notes (Signed)
Pt having 1st phone call. Updated his mother that pharmacist said the most recent change in meds was in March when the thorazine dosage was increased and that that medication can cause weight gain.

## 2022-09-06 DIAGNOSIS — R454 Irritability and anger: Secondary | ICD-10-CM | POA: Diagnosis not present

## 2022-09-06 NOTE — ED Provider Notes (Signed)
Emergency Medicine Observation Re-evaluation Note  Maurice [name] is a 27 y.o. male, seen on rounds today.  Pt initially presented to the ED for complaints of Suicidal Currently, the patient is eating breakfast.  Physical Exam  BP 124/87 (BP Location: Left Arm)   Pulse 91   Temp 97.7 F (36.5 C)   Resp 17   Ht 5\' 11"  (1.803 m)   Wt 67.1 kg   SpO2 99%   BMI 20.63 kg/m  Physical Exam General: Awake, alert, nondistressed Cardiac: Extremities well-perfused Lungs: Breathing is unlabored Psych: No agitation  ED Course / MDM  EKG:EKG Interpretation  Date/Time:  Tuesday August 12 2022 20:54:35 EDT Ventricular Rate:  81 PR Interval:  112 QRS Duration: 76 QT Interval:  352 QTC Calculation: 408 R Axis:   39 Text Interpretation: Normal sinus rhythm Normal ECG When compared with ECG of 07-Aug-2017 17:16, PREVIOUS ECG IS PRESENT Confirmed by Derwood Kaplan [phone]) on 08/12/2022 10:39:56 PM  I have reviewed the labs performed to date as well as medications administered while in observation.  Recent changes in the last 24 hours include none.  Plan  Current plan is for group home placement.    Gloris Manchester, MD 09/06/22 1209

## 2022-09-06 NOTE — ED Notes (Signed)
Report recieved assumed care at this time Pt alseep resp even and non labored in view of RN station with sitter

## 2022-09-07 DIAGNOSIS — R454 Irritability and anger: Secondary | ICD-10-CM | POA: Diagnosis not present

## 2022-09-07 NOTE — ED Notes (Signed)
Called staffing for a sitter and per staffing no sitter available

## 2022-09-07 NOTE — ED Notes (Signed)
Called staffing for a sitter, no sitters at this time per staffing.

## 2022-09-07 NOTE — ED Provider Notes (Signed)
Emergency Medicine Observation Re-evaluation Note  Maurice [name] is a 27 y.o. male, seen on rounds today.  Pt initially presented to the ED for complaints of Suicidal Currently, the patient is coloring with crayons.  Physical Exam  BP 126/82 (BP Location: Left Arm)   Pulse 84   Temp 97.9 F (36.6 C)   Resp 16   Ht 5\' 11"  (1.803 m)   Wt 67.1 kg   SpO2 98%   BMI 20.63 kg/m  Physical Exam General: Awake, alert, nondistressed Cardiac: Extremities well-perfused Lungs: Breathing is unlabored Psych: Calm and cooperative  ED Course / MDM  EKG:EKG Interpretation  Date/Time:  Tuesday August 12 2022 20:54:35 EDT Ventricular Rate:  81 PR Interval:  112 QRS Duration: 76 QT Interval:  352 QTC Calculation: 408 R Axis:   39 Text Interpretation: Normal sinus rhythm Normal ECG When compared with ECG of 07-Aug-2017 17:16, PREVIOUS ECG IS PRESENT Confirmed by Derwood Kaplan [phone]) on 08/12/2022 10:39:56 PM  I have reviewed the labs performed to date as well as medications administered while in observation.  Recent changes in the last 24 hours include none.  Plan  Current plan is for group home placement.    Gloris Manchester, MD 09/07/22 1218

## 2022-09-07 NOTE — Progress Notes (Signed)
TOC CSW attempted to contact Maurice [name], legal guardian [phone]. CSW left HIPPA compliant message with my contact information.   Maurice [name], MSW, LCSW-A Pronouns:  She/Her/Hers [facility] Clinical Social Worker Direct Number:  [phone] [name]

## 2022-09-08 DIAGNOSIS — R454 Irritability and anger: Secondary | ICD-10-CM | POA: Diagnosis not present

## 2022-09-08 NOTE — ED Notes (Signed)
Pt attempted to make another call

## 2022-09-08 NOTE — Progress Notes (Signed)
CSW received a call back from [facility], legal guardian [phone]. CSW was told patients anticipated discharge date is Wednesday morning 09/10/22

## 2022-09-08 NOTE — ED Provider Notes (Signed)
Emergency Medicine Observation Re-evaluation Note  Maurice [name] is a 27 y.o. male, seen on rounds today.  Pt initially presented to the ED for complaints of Suicidal Currently, the patient is sleeping  Physical Exam  BP 108/70 (BP Location: Left Arm)   Pulse 74   Temp 97.9 F (36.6 C) (Oral)   Resp 17   Ht 5\' 11"  (1.803 m)   Wt 67.1 kg   SpO2 95%   BMI 20.63 kg/m  Physical Exam General: Sleeping Cardiac: well-perfused Lungs: Breathing is unlabored Psych: Sleeping  ED Course / MDM  EKG:EKG Interpretation  Date/Time:  Tuesday August 12 2022 20:54:35 EDT Ventricular Rate:  81 PR Interval:  112 QRS Duration: 76 QT Interval:  352 QTC Calculation: 408 R Axis:   39 Text Interpretation: Normal sinus rhythm Normal ECG When compared with ECG of 07-Aug-2017 17:16, PREVIOUS ECG IS PRESENT Confirmed by Derwood Kaplan [phone]) on 08/12/2022 10:39:56 PM  I have reviewed the labs performed to date as well as medications administered while in observation.  Recent changes in the last 24 hours include none.  Plan  Current plan is for group home placement.     Loetta Rough, MD 09/08/22 [phone]

## 2022-09-08 NOTE — Progress Notes (Signed)
CSW contacted Maurice [name], legal guardian [phone] to follow-up with placement. Ms. [name] stated she has not heard anything from [facility] or patients care coordinator. Ms. [name] stated she will reach out to the group home with [facility] today. CSW also sent an email this morning about placement and didn't get a response back from the group home or care coordinator, [name] with [facility].

## 2022-09-08 NOTE — ED Notes (Signed)
Pt on phone with mother. 

## 2022-09-08 NOTE — ED Notes (Signed)
Pt had a great day. He was polite and calm. He cleaned up after himself as well.

## 2022-09-09 DIAGNOSIS — R454 Irritability and anger: Secondary | ICD-10-CM | POA: Diagnosis not present

## 2022-09-09 NOTE — Progress Notes (Signed)
Anticipated discharge is tomorrow morning. CSW is waiting to receive details from patients legal guardian on the specific time and who will be picking patient up. Medications will need to be sent to patients preferred pharmacy.

## 2022-09-09 NOTE — ED Provider Notes (Signed)
Emergency Medicine Observation Re-evaluation Note  Maurice [name] is a 27 y.o. male, seen on rounds today.  Pt initially presented to the ED for complaints of Suicidal Currently, the patient is resting in NAD.  Physical Exam  BP 116/65 (BP Location: Right Arm)   Pulse 77   Temp 98.4 F (36.9 C)   Resp 15   Ht 5\' 11"  (1.803 m)   Wt 67.1 kg   SpO2 97%   BMI 20.63 kg/m  Physical Exam General: Appears to be resting comfortably in bed, no acute distress. Cardiac: Regular rate, normal heart rate, non-emergent blood pressure for this morning's vitals. Lungs: No increased work of breathing.  Equal chest rise appreciated Psych: Calm, asleep in bed.   ED Course / MDM  EKG:EKG Interpretation  Date/Time:  Tuesday August 12 2022 20:54:35 EDT Ventricular Rate:  81 PR Interval:  112 QRS Duration: 76 QT Interval:  352 QTC Calculation: 408 R Axis:   39 Text Interpretation: Normal sinus rhythm Normal ECG When compared with ECG of 07-Aug-2017 17:16, PREVIOUS ECG IS PRESENT Confirmed by Derwood Kaplan [phone]) on 08/12/2022 10:39:56 PM  I have reviewed the labs performed to date as well as medications administered while in observation.   Plan  Current plan is for [facility] disposition determination.    Glyn Ade, MD 09/09/22 [phone]

## 2022-09-10 DIAGNOSIS — R454 Irritability and anger: Secondary | ICD-10-CM | POA: Diagnosis not present

## 2022-09-10 MED ORDER — CHLORPROMAZINE HCL 100 MG PO TABS: 100.0000 mg | ORAL_TABLET | Freq: Three times a day (TID) | ORAL | 0 refills | Status: AC

## 2022-09-10 MED ORDER — PANTOPRAZOLE SODIUM 40 MG PO TBEC: 40.0000 mg | DELAYED_RELEASE_TABLET | Freq: Every day | ORAL | 0 refills | Status: AC

## 2022-09-10 MED ORDER — MONTELUKAST SODIUM 10 MG PO TABS: 10.0000 mg | ORAL_TABLET | Freq: Every day | ORAL | 0 refills | Status: AC

## 2022-09-10 MED ORDER — VYVANSE 50 MG PO CAPS: 50.0000 mg | ORAL_CAPSULE | Freq: Every morning | ORAL | 0 refills | Status: AC

## 2022-09-10 MED ORDER — CLONIDINE HCL 0.1 MG PO TABS: 0.0500 mg | ORAL_TABLET | Freq: Two times a day (BID) | ORAL | 0 refills | Status: AC | PRN

## 2022-09-10 MED ORDER — OLANZAPINE 5 MG PO TABS: 5.0000 mg | ORAL_TABLET | Freq: Every day | ORAL | 0 refills | Status: AC

## 2022-09-10 MED ORDER — ZOLPIDEM TARTRATE 5 MG PO TABS: 5.0000 mg | ORAL_TABLET | Freq: Every evening | ORAL | 0 refills | Status: DC | PRN

## 2022-09-10 MED ORDER — HALOPERIDOL 5 MG PO TABS: 5.0000 mg | ORAL_TABLET | Freq: Two times a day (BID) | ORAL | 0 refills | Status: AC

## 2022-09-10 MED ORDER — OLANZAPINE 15 MG PO TBDP: 15.0000 mg | ORAL_TABLET | Freq: Every morning | ORAL | 0 refills | Status: AC

## 2022-09-10 MED ORDER — GABAPENTIN 300 MG PO CAPS: 600.0000 mg | ORAL_CAPSULE | Freq: Three times a day (TID) | ORAL | 0 refills | Status: AC

## 2022-09-10 NOTE — ED Notes (Signed)
Belongings returned to patient so he can get dressed. Ride is here to pick up patient per SW.

## 2022-09-10 NOTE — ED Notes (Signed)
AVS with prescriptions provided to and discussed with patient and legal guardian. Pt verbalizes understanding of discharge instructions and denies any questions or concerns at this time. Pt has ride home. Pt ambulated out of department independently with steady gait.

## 2022-09-10 NOTE — ED Provider Notes (Signed)
Emergency Medicine Observation Re-evaluation Note  Maurice [name] is a 27 y.o. male, seen on rounds today.  Pt initially presented to the ED for complaints of Suicidal Currently, the patient is awaiting d/c.  Physical Exam  BP 134/86   Pulse (!) 104   Temp 98.6 F (37 C) (Oral)   Resp 17   Ht 5\' 11"  (1.803 m)   Wt 67.1 kg   SpO2 100%   BMI 20.63 kg/m  Physical Exam General: Calm Cardiac: Well perfused Lungs: Even respirations Psych: Calm  ED Course / MDM  EKG:EKG Interpretation  Date/Time:  Tuesday August 12 2022 20:54:35 EDT Ventricular Rate:  81 PR Interval:  112 QRS Duration: 76 QT Interval:  352 QTC Calculation: 408 R Axis:   39 Text Interpretation: Normal sinus rhythm Normal ECG When compared with ECG of 07-Aug-2017 17:16, PREVIOUS ECG IS PRESENT Confirmed by Derwood Kaplan [phone]) on 08/12/2022 10:39:56 PM  I have reviewed the labs performed to date as well as medications administered while in observation.  Recent changes in the last 24 hours include patient cleared for discharge.  Plan  Current plan is for discharge. Home medications sent to pharmacy of choice with a 30 day supply. Patient has a ride home.     Maia Plan, MD 09/10/22 [phone]

## 2023-04-27 ENCOUNTER — Encounter (HOSPITAL_COMMUNITY): Payer: Self-pay

## 2023-04-27 ENCOUNTER — Ambulatory Visit (HOSPITAL_COMMUNITY)
Admission: RE | Admit: 2023-04-27 | Discharge: 2023-04-27 | Disposition: A | Discharge status: Home / Self Care | Payer: 59 | Source: Ambulatory Visit

## 2023-04-27 VITALS — BP 116/74 | HR 90 | Temp 99.6°F | Resp 16 | Ht 70.0 in | Wt 193.0 lb

## 2023-04-27 DIAGNOSIS — S61411A Laceration without foreign body of right hand, initial encounter: Secondary | ICD-10-CM | POA: Diagnosis not present

## 2023-04-27 DIAGNOSIS — Z23 Encounter for immunization: Secondary | ICD-10-CM

## 2023-04-27 MED ORDER — TETANUS-DIPHTH-ACELL PERTUSSIS 5-2.5-18.5 LF-MCG/0.5 IM SUSY
0.5000 mL | PREFILLED_SYRINGE | Freq: Once | INTRAMUSCULAR | Status: AC
  Administered 2023-04-27: 0.5 mL via INTRAMUSCULAR

## 2023-04-27 MED ORDER — TETANUS-DIPHTH-ACELL PERTUSSIS 5-2.5-18.5 LF-MCG/0.5 IM SUSY
PREFILLED_SYRINGE | INTRAMUSCULAR | Status: AC
  Filled 2023-04-27: qty 0.5

## 2023-04-27 NOTE — ED Provider Notes (Signed)
[facility]    CSN: [id number] Arrival date & time: 04/27/23  1557      History   Chief Complaint Chief Complaint  Patient presents with   Laceration    HPI Maurice [name] is a 27 y.o. male.   Patient here today accompanied by group home staff for evaluation of a laceration to his right hand.  Laceration occurred 2 days ago when he was attempting to cut a zip tie.  Caregiver attempted to bring patient here to urgent care yesterday however there was a long wait.  Given is concerned as he is overdue for a tetanus vaccine. Caregiver has been applying Neosporin to wound twice daily and applying bandage.   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 No pertinent surgical history.     Home Medications    Prior to Admission medications   Medication Sig Start Date End Date Taking? Authorizing  Provider  Adapalene 0.3 % gel Apply 1 Application topically in the morning and at bedtime. 08/11/22   [provider]  albuterol (PROAIR HFA) 108 (90 Base) MCG/ACT inhaler Inhale 2 puffs into the lungs every 6 (six) hours as needed for wheezing or shortness of breath. 08/26/17   Mesner, Barbara Cower, MD  cetirizine (ZYRTEC) 10 MG tablet Take 10 mg by mouth at bedtime.    [provider]  chlorproMAZINE (THORAZINE) 100 MG tablet Take 1 tablet (100 mg total) by mouth 3 (three) times daily. 09/10/22   Long, Arlyss Repress, MD  cloNIDine (CATAPRES) 0.1 MG tablet Take 0.5 tablets (0.05 mg total) by mouth 2 (two) times daily as needed (agitation). 09/10/22   Long, Arlyss Repress, MD  fluticasone (FLONASE) 50 MCG/ACT nasal spray Place 2 sprays into both nostrils daily. For allergies Patient not taking: Reported on 08/12/2022 08/14/17   Armandina Stammer I, NP  fluticasone (FLOVENT HFA) 220 MCG/ACT inhaler Inhale 2 puffs into the lungs daily. For asthma Patient not taking: Reported on 08/12/2022 08/26/17   Mesner, Barbara Cower, MD  gabapentin (NEURONTIN) 300 MG capsule Take 2 capsules (600 mg total) by mouth 3 (three) times daily. For agitation 09/10/22   Long, Arlyss Repress, MD  haloperidol (HALDOL) 5 MG tablet Take 1 tablet (5 mg total) by mouth 2 (two) times daily. 09/10/22 10/10/22  Long, Arlyss Repress, MD  montelukast (SINGULAIR) 10 MG tablet Take 1 tablet (10 mg total) by mouth at bedtime. 09/10/22 10/10/22  Long, Arlyss Repress, MD  OLANZapine (ZYPREXA) 5 MG tablet Take 1 tablet (5 mg total) by mouth at bedtime. 09/10/22 10/10/22  Long, Arlyss Repress, MD  OLANZapine zydis (ZYPREXA) 15 MG disintegrating tablet Take 1 tablet (15 mg total) by mouth in the morning. 09/10/22 10/10/22  Long, Arlyss Repress, MD  pantoprazole (PROTONIX) 40 MG tablet Take 1 tablet (40 mg total) by mouth daily. 09/10/22 10/10/22  Long, Arlyss Repress, MD  propranolol (INDERAL) 40 MG tablet Take 1 tablet (40 mg total) by mouth 3 (three) times daily. For anxiety Patient not taking: Reported on  08/12/2022 08/13/17   Armandina Stammer I, NP  traZODone (DESYREL) 100 MG tablet Take 100-200 mg by mouth at bedtime.    [provider]  VYVANSE 50 MG capsule Take 1 capsule (50 mg total) by mouth every morning. 09/10/22 10/10/22  Long, Arlyss Repress, MD  ziprasidone (GEODON) 80 MG capsule Take 1 capsule (80 mg total) by mouth 2 (two) times daily with a meal. For mood control Patient not taking: Reported on 08/12/2022 08/13/17   Sanjuana Kava, NP    Family History Family History  Adopted: Yes    Social History Social History   Tobacco Use   Smoking status: Every Day    Types: Cigarettes, Cigars   Smokeless tobacco: Never  Vaping Use   Vaping status: Never Used  Substance Use Topics   Alcohol use: Not Currently    Comment: I mix henessy with gatorade   Drug use: Not Currently    Types: Marijuana     Allergies   Patient has no known allergies.   Review of Systems Review of Systems Pertinent negatives listed in HPI  Physical Exam Triage Vital Signs ED Triage Vitals [04/27/23 1632]  Encounter Vitals Group     BP 116/74     Systolic BP Percentile      Diastolic BP Percentile      Pulse Rate 90     Resp 16     Temp 99.6 F (37.6 C)     Temp Source Oral     SpO2 97 %     Weight 193 lb (87.5 kg)     Height 5\' 10"  (1.778 m)     Head Circumference      Peak Flow      Pain Score 0     Pain Loc      Pain Education      Exclude from Growth Chart    No data found.  Updated Vital Signs BP 116/74 (BP Location: Left Arm)   Pulse 90   Temp 99.6 F (37.6 C) (Oral)   Resp 16   Ht 5\' 10"  (1.778 m)   Wt 193 lb (87.5 kg)   SpO2 97%   BMI 27.69 kg/m   Visual Acuity Right Eye Distance:   Left Eye Distance:   Bilateral Distance:    Right Eye Near:   Left Eye Near:    Bilateral Near:     Physical Exam Constitutional:      Appearance: Normal appearance.  HENT:     Head: Normocephalic and atraumatic.  Eyes:     Extraocular Movements: Extraocular movements intact.   Cardiovascular:     Rate and Rhythm: Normal rate and regular rhythm.  Pulmonary:     Effort: Pulmonary effort is normal.     Breath sounds: Normal breath sounds.  Musculoskeletal:       Arms:     Cervical back: Normal range of motion.  Skin:  General: Skin is warm.  Neurological:     Mental Status: He is alert.      UC Treatments / Results  Labs (all labs ordered are listed, but only abnormal results are displayed) Labs Reviewed - No data to display  EKG   Radiology No results found.  Procedures Procedures (including critical care time)  Medications Ordered in UC Medications  Tdap (BOOSTRIX) injection 0.5 mL (has no administration in time range)    Initial Impression / Assessment and Plan / UC Course  I have reviewed the triage vital signs and the nursing notes.  Pertinent labs & imaging results that were available during my care of the patient were reviewed by me and considered in my medical decision making (see chart for details).    Superficial laceration of the right palm, injury occurred x 2 days ago therefore additional interventions are warranted except for wound should remain covered at all times for when bathing and at bedtime.  Encouraged caregiver to continue applying Neosporin twice daily until wound completely heals.  Updated with tetanus vaccine.  Return precautions given as needed. Final Clinical Impressions(s) / UC Diagnoses   Final diagnoses:  Laceration of skin of right palm, initial encounter  Need for Tdap vaccination   Discharge Instructions   None    ED Prescriptions   None    PDMP not reviewed this encounter.   Bing Neighbors, NP 04/27/23 1655

## 2023-04-27 NOTE — ED Triage Notes (Signed)
Patient here today with c/o a laceration to the palm of his right hand Saturday morning. Patient was trying to open a package and cut it on the plastic zip tie. Not up to date on Tetanus.
